# Patient Record
Sex: Male | Born: 1975 | Race: White | Hispanic: No | Marital: Married | Smoking: Never smoker
Health system: Southern US, Community
[De-identification: ages and names within clinical notes are randomized; demographics above are authoritative.]

## PROBLEM LIST (undated history)

## (undated) DIAGNOSIS — Z87442 Personal history of urinary calculi: Secondary | ICD-10-CM

## (undated) DIAGNOSIS — Z8619 Personal history of other infectious and parasitic diseases: Secondary | ICD-10-CM

## (undated) DIAGNOSIS — K219 Gastro-esophageal reflux disease without esophagitis: Secondary | ICD-10-CM

## (undated) HISTORY — DX: Personal history of other infectious and parasitic diseases: Z86.19

## (undated) HISTORY — DX: Personal history of urinary calculi: Z87.442

## (undated) HISTORY — DX: Gastro-esophageal reflux disease without esophagitis: K21.9

## (undated) HISTORY — PX: NASAL SINUS SURGERY: SHX719

---

## 2007-02-25 ENCOUNTER — Emergency Department: Payer: Self-pay | Admitting: Emergency Medicine

## 2007-03-14 ENCOUNTER — Emergency Department (HOSPITAL_COMMUNITY): Admission: EM | Admit: 2007-03-14 | Discharge: 2007-03-14 | Payer: Self-pay | Admitting: Family Medicine

## 2008-11-30 ENCOUNTER — Emergency Department (HOSPITAL_COMMUNITY): Admission: EM | Admit: 2008-11-30 | Discharge: 2008-11-30 | Payer: Self-pay | Admitting: Family Medicine

## 2008-12-20 IMAGING — CT CT STONE STUDY
1 of 2 series · 15 of 32 positions shown, 19 images · non-contrast
Comparison: none

REASON FOR EXAM: flank pain
COMMENTS:

[Series 2: soft tissue · axial · 0.73mm/px · z∈[-1145,-707]mm · 15 of 166 slices shown, 19 images]
[im 13/166  soft-tissue]
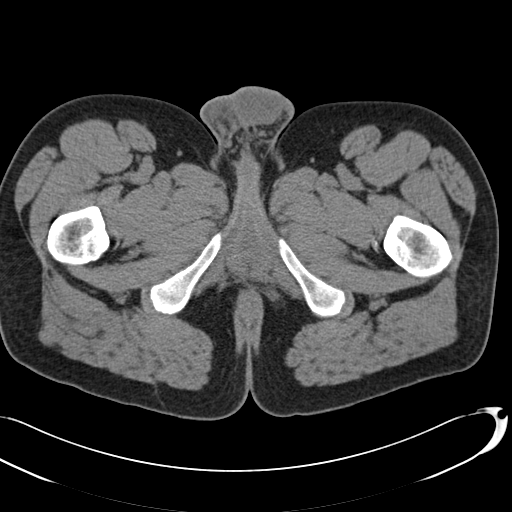
[im 13/166  bone]
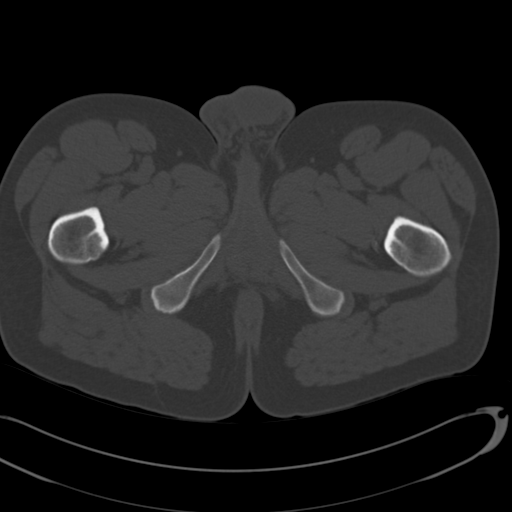
[im 25/166  soft-tissue]
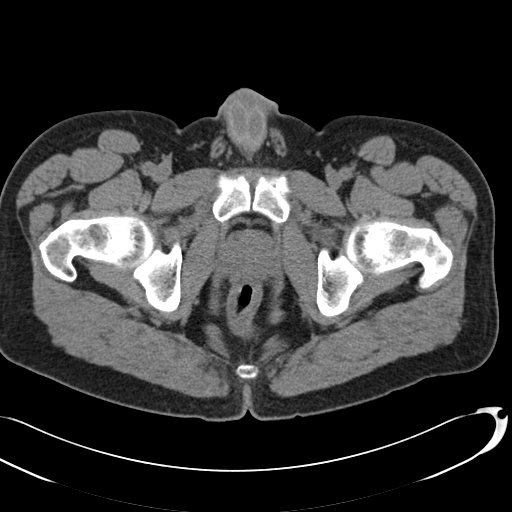
[im 37/166  soft-tissue]
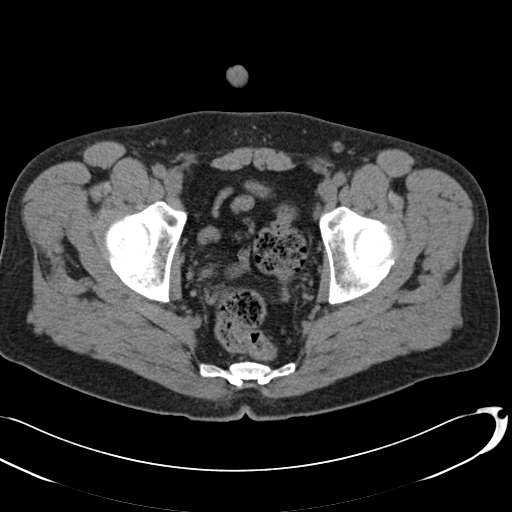
[im 49/166  soft-tissue]
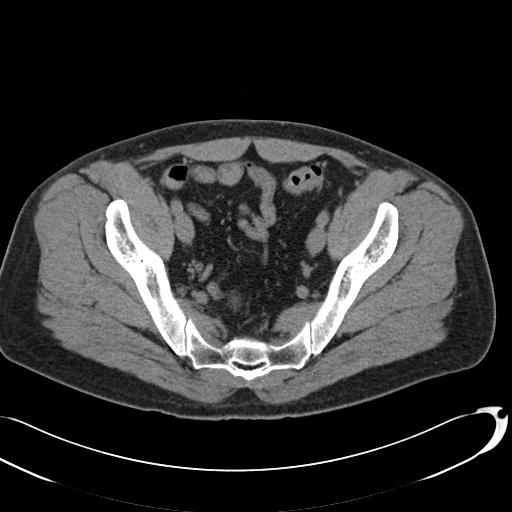
[im 62/166  soft-tissue]
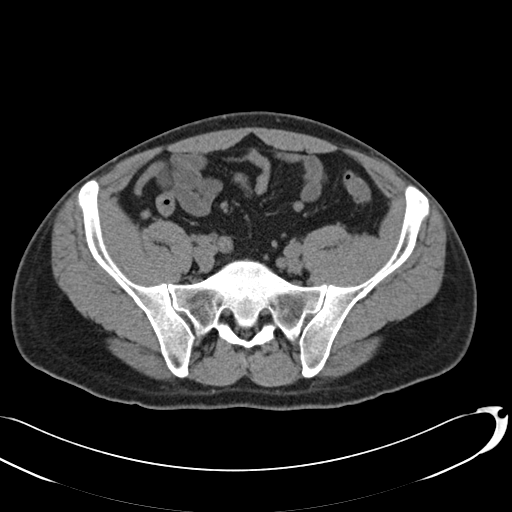
[im 74/166  soft-tissue]
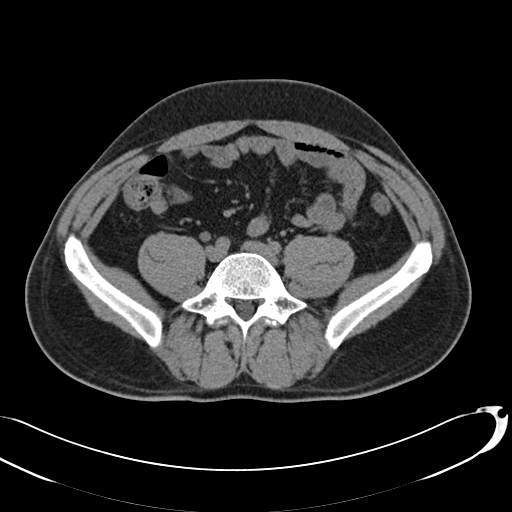
[im 86/166  soft-tissue]
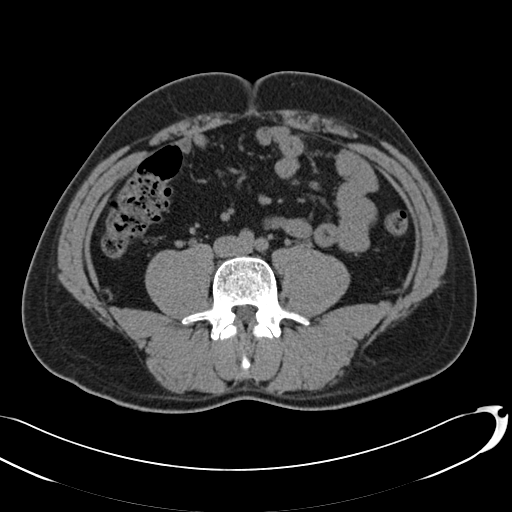
[im 98/166  soft-tissue]
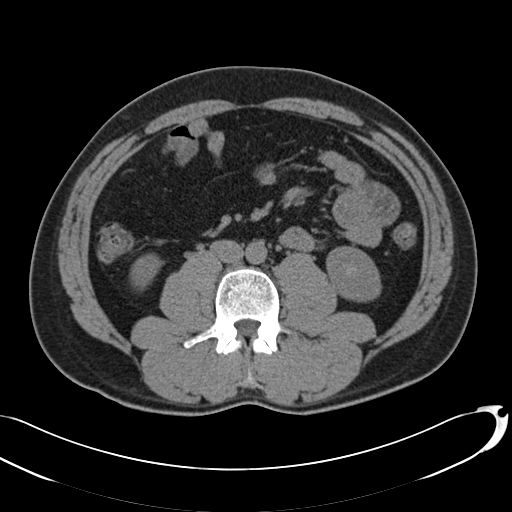
[im 111/166  soft-tissue]
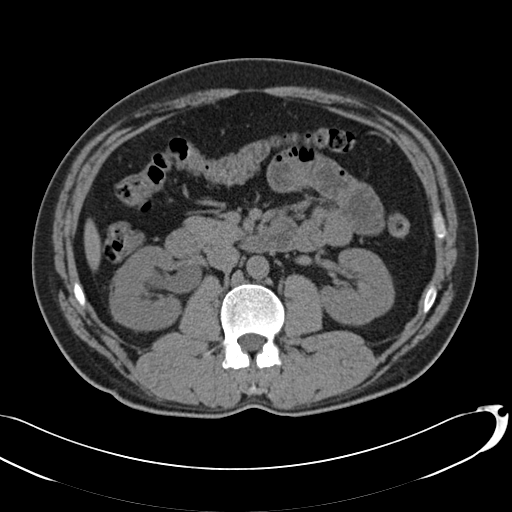
[im 111/166  bone]
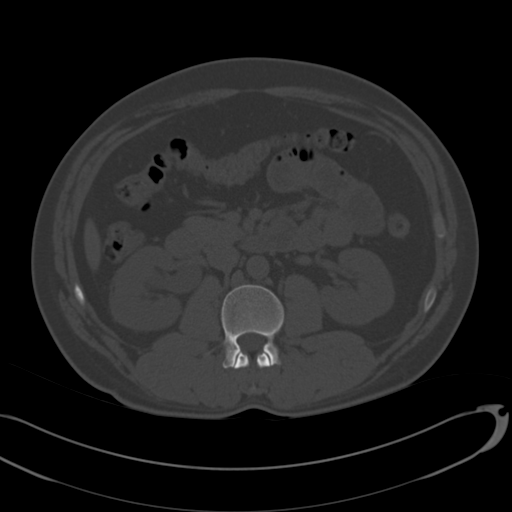
[im 123/166  soft-tissue]
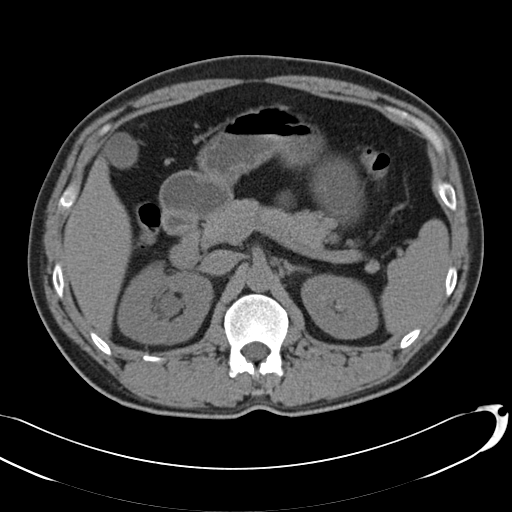
[im 135/166  soft-tissue]
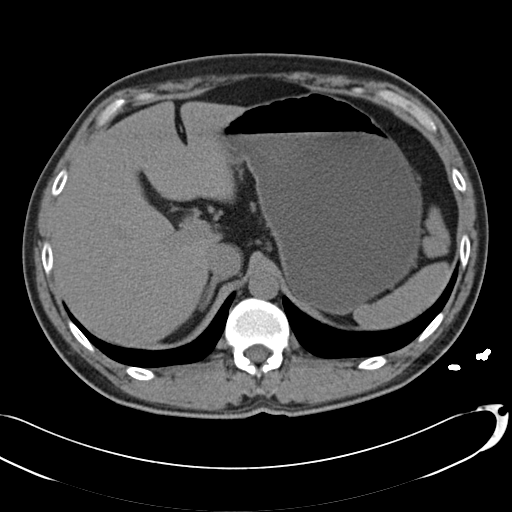
[im 141/166  lung]
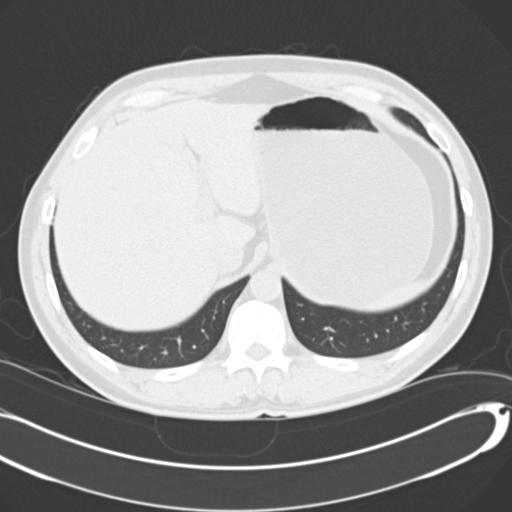
[im 147/166  soft-tissue]
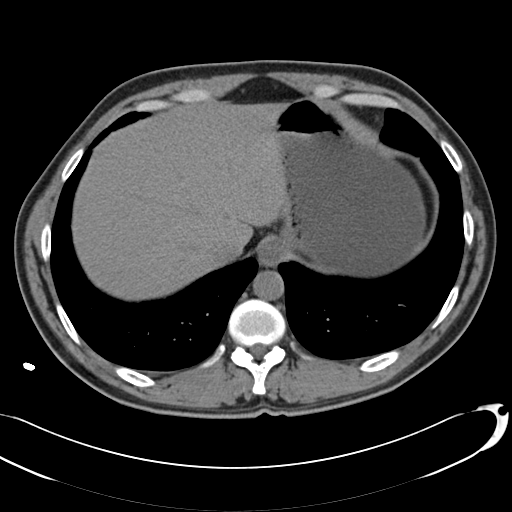
[im 147/166  lung]
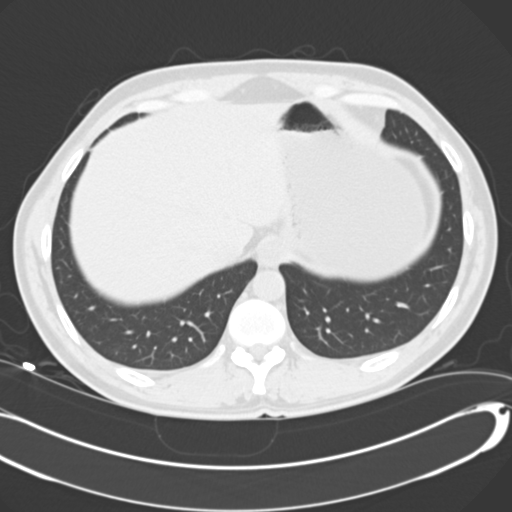
[im 153/166  lung]
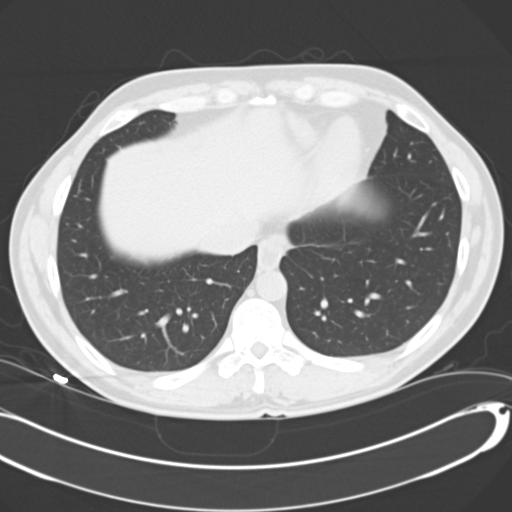
[im 159/166  soft-tissue]
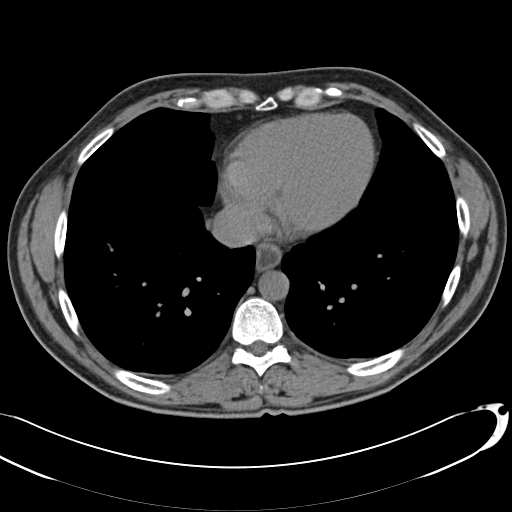
[im 159/166  lung]
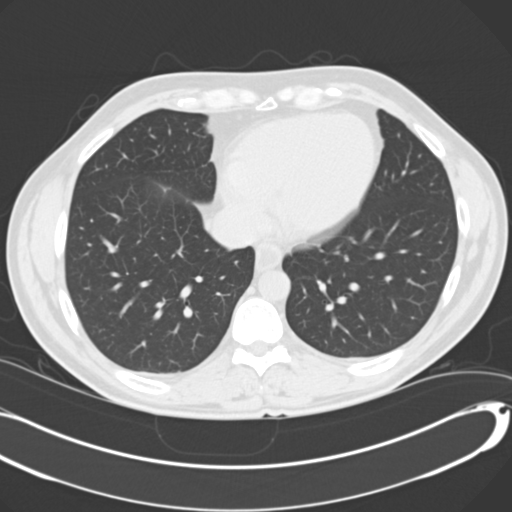

[15 of 32 positions shown; findings below may reference images not displayed]

PROCEDURE:     CT  - CT ABDOMEN /PELVIS WO (STONE)  - February 25, 2007  [DATE]

RESULT:     Helical non-contrast 3 mm sections were obtained from the lung
base through the pubic symphysis.

Evaluation of the lung bases demonstrates no gross abnormalities.

Within the limitations of a noncontrast CT, the liver, spleen, adrenals and
pancreas are unremarkable. Evaluation of the RIGHT kidney demonstrates a 3
mm non-obstructing medullary calculus. There is moderate hydronephrosis
involving the RIGHT kidney as well as moderate pelviectasis and moderate
hydroureter. Within the distal RIGHT ureter a 3.2 mm calculus is appreciated
in the region of the ureterovesical junction. Evaluation of the LEFT kidney
demonstrates subcentimeter calculi within the medullary portion of the
kidney. These are nonobstructing calculi. There is no evidence of abdominal
or pelvic free fluid, drainable loculated fluid collections, masses or
adenopathy. There is mild diverticulosis within the sigmoid colon.
IMPRESSION: 1)Vesicoureteral calculus on the RIGHT as described above with associated
moderate obstructive uropathy.

Dr. Gaur of the Emergency Room was informed of these findings at the time
of the initial interpretation.

## 2009-01-26 DIAGNOSIS — Z87442 Personal history of urinary calculi: Secondary | ICD-10-CM

## 2009-01-26 HISTORY — DX: Personal history of urinary calculi: Z87.442

## 2010-11-17 LAB — I-STAT 8, (EC8 V) (CONVERTED LAB)
BUN: 13
Glucose, Bld: 102 — ABNORMAL HIGH
Hemoglobin: 16.7
Potassium: 3.4 — ABNORMAL LOW
Sodium: 139

## 2010-11-17 LAB — POCT I-STAT CREATININE: Creatinine, Ser: 1

## 2011-04-13 ENCOUNTER — Encounter: Payer: Self-pay | Admitting: Family Medicine

## 2011-04-13 ENCOUNTER — Ambulatory Visit (INDEPENDENT_AMBULATORY_CARE_PROVIDER_SITE_OTHER): Payer: Self-pay | Admitting: Family Medicine

## 2011-04-13 DIAGNOSIS — Z0000 Encounter for general adult medical examination without abnormal findings: Secondary | ICD-10-CM

## 2011-04-13 DIAGNOSIS — R0609 Other forms of dyspnea: Secondary | ICD-10-CM

## 2011-04-13 DIAGNOSIS — K219 Gastro-esophageal reflux disease without esophagitis: Secondary | ICD-10-CM

## 2011-04-13 DIAGNOSIS — R0683 Snoring: Secondary | ICD-10-CM | POA: Insufficient documentation

## 2011-04-13 NOTE — Patient Instructions (Signed)
Return at your convenience fasting for blood work - we will get baseline cholesterol and sugar. Good to meet you today, call us with questions. Good job with healthy changes. Wear seat belt! Return as needed or in 1-2 years for next physical.

## 2011-04-13 NOTE — Assessment & Plan Note (Signed)
Reviewed preventative protocols and updated unless pt declined. Discussed healthy living, healthy diet.

## 2011-04-13 NOTE — Progress Notes (Signed)
Subjective:    Patient ID: Isaiah Mullins, male    DOB: November 05, 1975, 36 y.o.   MRN: [id number]  HPI CC: new pt establish, CPE  New pt, would like to establish.  No concerns today.  Would like CPE.  GERD - going on for 2 years, takes omeprazole 20mg  OTC daily.  Prior was on tums.  But this caused kidney stone.  Now on omeprazole.  No abd pain.  Sometimes clear mucous emesis.  Snoring - possible apneic episodes per wife (very quiet or seems like stops breathing then loud snore).  No PNdyspnea.  Restorative sleep.  Denies daytime sonmolence.  Preventative: Unsure last CPE.  Last blood work was 6 mo ago, for new job, unsure what was checked. No flu - declines Tetanus - ~2007 thinks, unsure. discussed seat belt use. Sunscreen when out in sun.  No skin concerns today.  Caffeine: some sweet tea, coffee but not daily Lives with wife, son (2003), 2 cats Occupation: Airline pilot rep at [employer]  Activity: running, working out 3-4days /wk.  Plays golf Diet: good water, vegetables daily, working on more fruits, currently on no wheat diet, red meat 2x/wk, fish 2x/wk  Medications and allergies reviewed and updated in chart.  Past histories reviewed and updated if relevant as below. There is no problem list on file for this patient.  Past Medical History  Diagnosis Date  . GERD (gastroesophageal reflux disease)   . History of kidney stones 01/2009    attributed to tums  . History of chicken pox    Past Surgical History  Procedure Date  . Nasal sinus surgery 1990s    deviated septum   History  Substance Use Topics  . Smoking status: Never Smoker   . Smokeless tobacco: Never Used  . Alcohol Use: Yes     social   Family History  Problem Relation Age of Onset  . Coronary artery disease Neg Hx   . Stroke Neg Hx   . Diabetes Neg Hx   . Cancer Neg Hx    No Known Allergies No current outpatient prescriptions on file prior to visit.   Review of Systems  Constitutional: Negative for  fever, chills, activity change, appetite change, fatigue and unexpected weight change.  HENT: Negative for hearing loss and neck pain.   Eyes: Negative for visual disturbance.  Respiratory: Negative for cough, chest tightness, shortness of breath and wheezing.   Cardiovascular: Negative for chest pain, palpitations and leg swelling.  Gastrointestinal: Negative for nausea, vomiting, abdominal pain, diarrhea, constipation, blood in stool and abdominal distention.  Genitourinary: Negative for hematuria and difficulty urinating.  Musculoskeletal: Negative for myalgias and arthralgias.  Skin: Negative for rash.  Neurological: Negative for dizziness, seizures, syncope and headaches.  Hematological: Does not bruise/bleed easily.  Psychiatric/Behavioral: Negative for dysphoric mood. The patient is not nervous/anxious.        Objective:   Physical Exam  Nursing note and vitals reviewed. Constitutional: He is oriented to person, place, and time. He appears well-developed and well-nourished. No distress.  HENT:  Head: Normocephalic and atraumatic.  Right Ear: Hearing, tympanic membrane, external ear and ear canal normal.  Left Ear: Hearing, tympanic membrane, external ear and ear canal normal.  Nose: Nose normal. No mucosal edema or rhinorrhea.  Mouth/Throat: Uvula is midline, oropharynx is clear and moist and mucous membranes are normal. No oropharyngeal exudate, posterior oropharyngeal edema, posterior oropharyngeal erythema or tonsillar abscesses.       No tonsillar hypertrophy  Eyes: Conjunctivae and EOM  are normal. Pupils are equal, round, and reactive to light. No scleral icterus.  Neck: Normal range of motion. Neck supple. No thyromegaly present.  Cardiovascular: Normal rate, regular rhythm, normal heart sounds and intact distal pulses.   No murmur heard. Pulses:      Radial pulses are 2+ on the right side, and 2+ on the left side.  Pulmonary/Chest: Effort normal and breath sounds normal.  No respiratory distress. He has no wheezes. He has no rales.  Abdominal: Soft. Bowel sounds are normal. He exhibits no distension and no mass. There is no tenderness. There is no rebound and no guarding.  Musculoskeletal: Normal range of motion.  Lymphadenopathy:    He has no cervical adenopathy.  Neurological: He is alert and oriented to person, place, and time.       CN grossly intact, station and gait intact  Skin: Skin is warm and dry. No rash noted.  Psychiatric: He has a normal mood and affect. His behavior is normal. Judgment and thought content normal.      Assessment & Plan:

## 2011-04-13 NOTE — Assessment & Plan Note (Signed)
Stable on omeprazole. 

## 2011-04-13 NOTE — Assessment & Plan Note (Addendum)
Restorative sleep, no daytime somnolence. Doubt significant OSA component. Discussed reasonable to do sleep study given wife endorsing worsening snoring, pt will think about it. epworth sleepiness score = 5, low risk today.  Already working on weight loss.

## 2011-04-19 ENCOUNTER — Telehealth: Payer: Self-pay | Admitting: Family Medicine

## 2011-04-19 NOTE — Telephone Encounter (Signed)
Noted. Thanks.

## 2011-04-19 NOTE — Telephone Encounter (Signed)
Triage Record Num: [id number] Operator: Di Kindle Patient Name: Isaiah Mullins Call Date & Time: 04/19/2011 11:57:20AM Patient Phone: [phone] PCP: Eustaquio Boyden Patient Gender: Male PCP Fax : [phone] Patient DOB: 07/13/1975 Practice Name: [facility] Reason for Call: Caller: Crista Elliot; PCP: Eustaquio Boyden; CB#: [phone]; Call regarding Cough/Congestion; with fever, onset 04/18/11, calling for appt. Same offered and accepted for 04/20/11 @ 1115 with Dr Para March. Protocol(s) Used: Information Only Call; No Symptom Triage (Adult) Recommended Outcome per Protocol: Call Provider When Office is Open Reason for Outcome: Requesting regular office appointment

## 2011-04-20 ENCOUNTER — Other Ambulatory Visit: Payer: Self-pay | Admitting: Family Medicine

## 2011-04-20 ENCOUNTER — Ambulatory Visit: Payer: Self-pay | Admitting: Family Medicine

## 2011-04-20 DIAGNOSIS — Z0000 Encounter for general adult medical examination without abnormal findings: Secondary | ICD-10-CM

## 2011-04-23 ENCOUNTER — Other Ambulatory Visit: Payer: Self-pay

## 2012-01-01 ENCOUNTER — Encounter: Payer: Self-pay | Admitting: Family Medicine

## 2012-01-01 ENCOUNTER — Ambulatory Visit (INDEPENDENT_AMBULATORY_CARE_PROVIDER_SITE_OTHER): Payer: BC Managed Care – PPO | Admitting: Family Medicine

## 2012-01-01 VITALS — BP 136/84 | HR 86 | Temp 98.4°F | Ht 71.0 in | Wt 187.2 lb

## 2012-01-01 DIAGNOSIS — J209 Acute bronchitis, unspecified: Secondary | ICD-10-CM | POA: Insufficient documentation

## 2012-01-01 DIAGNOSIS — J069 Acute upper respiratory infection, unspecified: Secondary | ICD-10-CM

## 2012-01-01 MED ORDER — GUAIFENESIN-CODEINE 100-10 MG/5ML PO SYRP: 5.0000 mL | ORAL_SOLUTION | Freq: Every evening | ORAL | Status: DC | PRN

## 2012-01-01 NOTE — Assessment & Plan Note (Signed)
With reassuring exam  Disc symptomatic care - see instructions on AVS  Given robitussin ac for night time use  Update if not starting to improve in a week or if worsening

## 2012-01-01 NOTE — Progress Notes (Signed)
  Subjective:    Patient ID: Isaiah Mullins, male    DOB: 10/29/1975, 36 y.o.   MRN: [id number]  HPI Here with uri symptoms Started sat night with chills and exhaustion  Going down hill ever since   Lots of post nasal drip- made him throw up several times  Taking mucinex  Clear mucous  Also bad cough - all night last night  Some prod from the drip  Ears are popping - pressure  Throat is not sore   Thinks he had a fever at home -not now   No particular sick contacts   Took some nyquil last night   Patient Active Problem List  Diagnosis  . Health care maintenance  . GERD (gastroesophageal reflux disease)  . Snoring   Past Medical History  Diagnosis Date  . GERD (gastroesophageal reflux disease)   . History of kidney stones 01/2009    attributed to tums  . History of chicken pox    Past Surgical History  Procedure Date  . Nasal sinus surgery 1990s    deviated septum   History  Substance Use Topics  . Smoking status: Never Smoker   . Smokeless tobacco: Never Used  . Alcohol Use: Yes     Comment: social   Family History  Problem Relation Age of Onset  . Coronary artery disease Neg Hx   . Stroke Neg Hx   . Diabetes Neg Hx   . Cancer Neg Hx    No Known Allergies Current Outpatient Prescriptions on File Prior to Visit  Medication Sig Dispense Refill  . omeprazole (PRILOSEC) 20 MG capsule Take 20 mg by mouth daily.          Review of Systems Review of Systems  Constitutional: Negative for  appetite change, and unexpected weight change. pos for fatigue Eyes: Negative for pain and visual disturbance.  ENT pos for congestion and post nasal drainage and ST, neg for sinus pain  Respiratory: Negative for wheeze and shortness of breath.   Cardiovascular: Negative for cp or palpitations    Gastrointestinal: Negative for nausea, diarrhea and constipation.  Genitourinary: Negative for urgency and frequency.  Skin: Negative for pallor or rash   Neurological:  Negative for weakness, light-headedness, numbness and headaches.  Hematological: Negative for adenopathy. Does not bruise/bleed easily.  Psychiatric/Behavioral: Negative for dysphoric mood. The patient is not nervous/anxious.         Objective:   Physical Exam  Constitutional: He appears well-developed and well-nourished. No distress.  HENT:  Head: Normocephalic and atraumatic.  Right Ear: External ear normal.  Left Ear: External ear normal.  Mouth/Throat: Oropharynx is clear and moist. No oropharyngeal exudate.       Nares are injected and congested   No facial tenderness  Eyes: Conjunctivae normal and EOM are normal. Pupils are equal, round, and reactive to light. Right eye exhibits no discharge. Left eye exhibits no discharge.  Neck: Normal range of motion. Neck supple.  Cardiovascular: Normal rate, regular rhythm, normal heart sounds and intact distal pulses.  Exam reveals no gallop.   Pulmonary/Chest: Effort normal and breath sounds normal. No respiratory distress. He has no wheezes. He has no rales. He exhibits no tenderness.  Lymphadenopathy:    He has no cervical adenopathy.  Neurological: He is alert.  Skin: Skin is warm and dry. No rash noted.  Psychiatric: He has a normal mood and affect.          Assessment & Plan:

## 2012-01-01 NOTE — Patient Instructions (Addendum)
Hold off the mucinex for now  Instead, take zyrtec 10 mg one pill daily - this will help dry up the drip  Use the codeine cough syrup at night to help cough  Tylenol for fever if needed  Breathe steam if you get congested, nasal saline spray helps too  Update if not starting to improve in a week or if worsening

## 2013-08-18 ENCOUNTER — Encounter: Payer: Self-pay | Admitting: Family Medicine

## 2013-08-18 ENCOUNTER — Ambulatory Visit (INDEPENDENT_AMBULATORY_CARE_PROVIDER_SITE_OTHER): Payer: BC Managed Care – PPO | Admitting: Family Medicine

## 2013-08-18 ENCOUNTER — Ambulatory Visit: Payer: Self-pay | Admitting: Family Medicine

## 2013-08-18 ENCOUNTER — Telehealth: Payer: Self-pay | Admitting: Family Medicine

## 2013-08-18 VITALS — BP 136/78 | HR 80 | Temp 98.0°F | Wt 181.5 lb

## 2013-08-18 DIAGNOSIS — J209 Acute bronchitis, unspecified: Secondary | ICD-10-CM

## 2013-08-18 MED ORDER — GUAIFENESIN-CODEINE 100-10 MG/5ML PO SYRP: 5.0000 mL | ORAL_SOLUTION | Freq: Two times a day (BID) | ORAL | Status: DC | PRN

## 2013-08-18 MED ORDER — AZITHROMYCIN 250 MG PO TABS: ORAL_TABLET | ORAL | Status: DC

## 2013-08-18 NOTE — Telephone Encounter (Signed)
Patient Information:  [name]  Phone: [phone]  Patient: Isaiah Mullins, Isaiah Mullins  Gender: Male  DOB: 05/05/1975  Age: 38 Years  PCP: Eustaquio BoydenGutierrez, [facility])  Office Follow Up:  Does the office need to follow up with this patient?: No  Instructions For The Office: N/A  RN Note:  No appointments remain for [facility]13 [facility] or [facility].  Scheduled at 1345 08/18/13 with Dr Tawanna Coolerodd at [facility] per caller request.   Symptoms  Reason For Call & Symptoms: Cold symptoms for 3-4 weeks with clear mucus and gagging at times.  Declined triage. Called for appointment and was transferred to triage due to lack of appointments. Life threatening symptoms ruled out.  Reviewed Health History In EMR: N/A  Reviewed Medications In EMR: N/A  Reviewed Allergies In EMR: N/A  Reviewed Surgeries / Procedures: N/A  Date of Onset of Symptoms: 07/20/2013  Guideline(s) Used:  No Protocol Available - Sick Adult  Disposition Per Guideline:   See Today in Office  Reason For Disposition Reached:   Patient wants to be seen  Advice Given:  Call Back If:  New symptoms develop  You become worse.  Patient Will Follow Care Advice:  YES  Appointment Scheduled:  08/18/2013 13:45:00 Appointment Scheduled Provider:  Other

## 2013-08-18 NOTE — Progress Notes (Signed)
Pre visit review using our clinic review tool, if applicable. No additional management support is needed unless otherwise documented below in the visit note. 

## 2013-08-18 NOTE — Progress Notes (Signed)
   BP 136/78  Pulse 80  Temp(Src) 98 F (36.7 C) (Oral)  Wt 181 lb 8 oz (82.328 kg)  SpO2 97%   CC: cough  Subjective:    Patient ID: Isaiah Mullins, male    DOB: 04/21/1975, 38 y.o.   MRN: [id number]  HPI: Isaiah Mullins is a 38 y.o. male presenting on 08/18/2013 for URI   1 mo h/o cough that started with ST and congestion and has progressed to persistent cough, PNdrainage with clear thick phlegm in throat in am, worse with phlegm in am.  + chest congestion.  No fevers/chills, abd pain, nausea, ear or tooth pain, headaches. No dyspnea or wheezing.  Tried dayquil, nyquil, mucinex. Wife sick with similar sxs - dx with URI.  Under more stress recently - going through bad divorce, trouble sleeping. H/o GERD - controlled on prilosec/nexium. No h/o allergic rhinitis, asthma. No smokers at home.  Relevant past medical, surgical, family and social history reviewed and updated as indicated.  Allergies and medications reviewed and updated. Current Outpatient Prescriptions on File Prior to Visit  Medication Sig  . omeprazole (PRILOSEC) 20 MG capsule Take 20 mg by mouth daily.   No current facility-administered medications on file prior to visit.    Review of Systems Per HPI unless specifically indicated above    Objective:    BP 136/78  Pulse 80  Temp(Src) 98 F (36.7 C) (Oral)  Wt 181 lb 8 oz (82.328 kg)  SpO2 97%  Physical Exam  Nursing note and vitals reviewed. Constitutional: He appears well-developed and well-nourished. No distress.  HENT:  Head: Normocephalic and atraumatic.  Right Ear: Hearing, tympanic membrane, external ear and ear canal normal.  Left Ear: Hearing, tympanic membrane, external ear and ear canal normal.  Nose: Nose normal. No mucosal edema or rhinorrhea. Right sinus exhibits no maxillary sinus tenderness and no frontal sinus tenderness. Left sinus exhibits no maxillary sinus tenderness and no frontal sinus tenderness.  Mouth/Throat: Uvula is  midline, oropharynx is clear and moist and mucous membranes are normal. No oropharyngeal exudate, posterior oropharyngeal edema, posterior oropharyngeal erythema or tonsillar abscesses.  Eyes: Conjunctivae and EOM are normal. Pupils are equal, round, and reactive to light. No scleral icterus.  Neck: Normal range of motion. Neck supple.  Cardiovascular: Normal rate, regular rhythm, normal heart sounds and intact distal pulses.   No murmur heard. Pulmonary/Chest: Effort normal and breath sounds normal. No respiratory distress. He has no wheezes. He has no rales.  Clear on exam today but persistent nagging cough present   Lymphadenopathy:    He has no cervical adenopathy.  Skin: Skin is warm and dry. No rash noted.       Assessment & Plan:   Problem List Items Addressed This Visit   Acute bronchitis - Primary     Given progression and duration of sxs - rec zpack abx to cover atypical organisms. Supportive care as per instructions. cheratussin for cough at night Red flags to return discussed.        Follow up plan: Return if symptoms worsen or fail to improve.

## 2013-08-18 NOTE — Assessment & Plan Note (Signed)
Given progression and duration of sxs - rec zpack abx to cover atypical organisms. Supportive care as per instructions. cheratussin for cough at night Red flags to return discussed.

## 2013-08-18 NOTE — Patient Instructions (Signed)
I think you do have bronchitis - treat with zpack antibiotic, codeine cough syrup for night time, and ibuprofen 400-600mg  twice daily with food during the day. Watch for fever >101, worsening productive cough or not improving as expected.

## 2013-08-18 NOTE — Telephone Encounter (Signed)
Will see today.  

## 2013-10-03 ENCOUNTER — Other Ambulatory Visit: Payer: Self-pay | Admitting: Family Medicine

## 2013-10-03 DIAGNOSIS — Z13220 Encounter for screening for lipoid disorders: Secondary | ICD-10-CM

## 2013-10-03 DIAGNOSIS — Z0000 Encounter for general adult medical examination without abnormal findings: Secondary | ICD-10-CM

## 2013-10-12 ENCOUNTER — Other Ambulatory Visit: Payer: BC Managed Care – PPO

## 2013-10-14 ENCOUNTER — Ambulatory Visit (INDEPENDENT_AMBULATORY_CARE_PROVIDER_SITE_OTHER): Payer: BC Managed Care – PPO | Admitting: Family Medicine

## 2013-10-14 ENCOUNTER — Encounter: Payer: Self-pay | Admitting: Family Medicine

## 2013-10-14 VITALS — BP 128/70 | HR 72 | Temp 98.0°F | Ht 71.0 in | Wt 185.5 lb

## 2013-10-14 DIAGNOSIS — K219 Gastro-esophageal reflux disease without esophagitis: Secondary | ICD-10-CM

## 2013-10-14 DIAGNOSIS — R198 Other specified symptoms and signs involving the digestive system and abdomen: Secondary | ICD-10-CM | POA: Insufficient documentation

## 2013-10-14 DIAGNOSIS — J392 Other diseases of pharynx: Secondary | ICD-10-CM

## 2013-10-14 DIAGNOSIS — Z0000 Encounter for general adult medical examination without abnormal findings: Secondary | ICD-10-CM

## 2013-10-14 DIAGNOSIS — Z13220 Encounter for screening for lipoid disorders: Secondary | ICD-10-CM

## 2013-10-14 LAB — BASIC METABOLIC PANEL
BUN: 18 mg/dL (ref 6–23)
CHLORIDE: 102 meq/L (ref 96–112)
CO2: 29 meq/L (ref 19–32)
CREATININE: 0.9 mg/dL (ref 0.4–1.5)
Calcium: 9.6 mg/dL (ref 8.4–10.5)
GFR: 96.75 mL/min (ref >60.00)
GLUCOSE: 88 mg/dL (ref 70–99)
POTASSIUM: 4 meq/L (ref 3.5–5.1)
Sodium: 137 mEq/L (ref 135–145)

## 2013-10-14 LAB — LIPID PANEL
CHOL/HDL RATIO: 4
CHOLESTEROL: 243 mg/dL — AB (ref 0–200)
HDL: 65.2 mg/dL (ref >39.00)
LDL CALC: 161 mg/dL — AB (ref 0–99)
NONHDL: 177.8
Triglycerides: 83 mg/dL (ref 0.0–149.0)
VLDL: 16.6 mg/dL (ref 0.0–40.0)

## 2013-10-14 MED ORDER — OMEPRAZOLE 40 MG PO CPDR: 40.0000 mg | DELAYED_RELEASE_CAPSULE | Freq: Every day | ORAL | Status: DC

## 2013-10-14 NOTE — Progress Notes (Signed)
Pre visit review using our clinic review tool, if applicable. No additional management support is needed unless otherwise documented below in the visit note. 

## 2013-10-14 NOTE — Patient Instructions (Signed)
Blood work today For mucous production - try increased omeprazole dose to 40mg  daily - I've sent this dose to your pharmacy. Try for next 3 weeks. If no noted improvement let me know, we may refer you to ENT for further evaluation Good to see you today, call us with quesitons. Return as needed or in 1-2 years for next physical

## 2013-10-14 NOTE — Assessment & Plan Note (Signed)
Describes increased mucous production worse in am that leads to 1-2 episodes of gagging up mucous each day, worse with food as well. ?GERD related as may have improved on higher omeprazole dose. No other rhinitis/PNdrainage symptoms. Will retrial increased omeprazole dose and if not better, consider ENT eval. No red flags today.

## 2013-10-14 NOTE — Assessment & Plan Note (Signed)
Preventative protocols reviewed and updated unless pt declined. Discussed healthy diet and lifestyle.  

## 2013-10-14 NOTE — Assessment & Plan Note (Signed)
See below - will increase omeprazole to 40mg  daily.

## 2013-10-14 NOTE — Progress Notes (Signed)
BP 128/70  Pulse 72  Temp(Src) 98 F (36.7 C) (Oral)  Ht 5\' 11"  (1.803 m)  Wt 185 lb 8 oz (84.142 kg)  BMI 25.88 kg/m2   CC: CPE  Subjective:    Patient ID: Isaiah Mullins, male    DOB: 03/11/1975, 38 y.o.   MRN: [id number]  HPI: Isaiah Mullins is a 38 y.o. male presenting on 10/14/2013 for Annual Exam   Wt Readings from Last 3 Encounters:  10/14/13 185 lb 8 oz (84.142 kg)  08/18/13 181 lb 8 oz (82.328 kg)  01/01/12 187 lb 4 oz (84.936 kg)   Body mass index is 25.88 kg/(m^2).  3 mo ago had irregular mole removed from R upper back (Luptom). 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  Bronchitis 07/2013. Treated with zpack which helped sxs, but has had persistent mucous productive that he expectorates/gagging. [place] [facility] and [facility] Also worse 30-45 min after eating. Always clear mucous, no food, no liquid. No noted PNDrainage. No h/o allergies, no allergic sxs like congestion or rhinorrhea or sneeze. No noted GERD unless he stops prilosec. Did take 40mg  prilosec which may have significantly helped. Cough also present if he takes a nap during the day. Worse in am  No nausea, good appetite, no early satiety, no dysphagia.  Preventative: flu shot - declines  Tetanus - ~2007 thinks. Discussed seat belt use.  Sunscreen when out in sun. No skin concerns today.   Caffeine: some sweet tea, coffee but not daily Divorced from wife 2015, son (2003), 2 cats Occupation: Tax advisersales rep at [employer]  Activity: running, working out 3-4days /wk.  Plays golf Diet: good water, vegetables daily, working on more fruits, currently on no wheat diet, red meat 2x/wk, fish 2x/wk  Relevant past medical, surgical, family and social history reviewed and updated as indicated.  Allergies and medications reviewed and updated. No current outpatient prescriptions on file prior to visit.   No current facility-administered medications on file prior to visit.    Review of Systems    Constitutional: Negative for fever, chills, activity change, appetite change, fatigue and unexpected weight change.  HENT: Negative for hearing loss.   Eyes: Negative for visual disturbance.  Respiratory: Negative for cough, chest tightness, shortness of breath and wheezing.   Cardiovascular: Negative for chest pain, palpitations and leg swelling.  Gastrointestinal: Negative for nausea, vomiting, abdominal pain, diarrhea, constipation, blood in stool and abdominal distention.  Genitourinary: Negative for hematuria and difficulty urinating.  Musculoskeletal: Negative for arthralgias, myalgias and neck pain.  Skin: Negative for rash.  Neurological: Negative for dizziness, seizures, syncope and headaches.  Hematological: Negative for adenopathy. Does not bruise/bleed easily.  Psychiatric/Behavioral: Negative for dysphoric mood. The patient is not nervous/anxious.    Per HPI unless specifically indicated above    Objective:    BP 128/70  Pulse 72  Temp(Src) 98 F (36.7 C) (Oral)  Ht 5\' 11"  (1.803 m)  Wt 185 lb 8 oz (84.142 kg)  BMI 25.88 kg/m2  Physical Exam  Nursing note and vitals reviewed. Constitutional: He is oriented to person, place, and time. He appears well-developed and well-nourished. No distress.  HENT:  Head: Normocephalic and atraumatic.  Right Ear: Hearing, tympanic membrane, external ear and ear canal normal.  Left Ear: Hearing, tympanic membrane, external ear and ear canal normal.  Nose: Nose normal.  Mouth/Throat: Uvula is midline, oropharynx is clear and moist and mucous membranes are normal. No oropharyngeal exudate, posterior oropharyngeal  edema or posterior oropharyngeal erythema.  Mild PNdrianage  Eyes: Conjunctivae and EOM are normal. Pupils are equal, round, and reactive to light. No scleral icterus.  Neck: Normal range of motion. Neck supple. No thyromegaly present.  Cardiovascular: Normal rate, regular rhythm, normal heart sounds and intact distal pulses.    No murmur heard. Pulses:      Radial pulses are 2+ on the right side, and 2+ on the left side.  Pulmonary/Chest: Effort normal and breath sounds normal. No respiratory distress. He has no wheezes. He has no rales.  Abdominal: Soft. Bowel sounds are normal. He exhibits no distension and no mass. There is no tenderness. There is no rebound and no guarding.  Musculoskeletal: Normal range of motion. He exhibits no edema.  Lymphadenopathy:    He has no cervical adenopathy.  Neurological: He is alert and oriented to person, place, and time.  CN grossly intact, station and gait intact  Skin: Skin is warm and dry. No rash noted.  Psychiatric: He has a normal mood and affect. His behavior is normal. Judgment and thought content normal.       Assessment & Plan:   Problem List Items Addressed This Visit   Episode of gagging     Describes increased mucous production worse in am that leads to 1-2 episodes of gagging up mucous each day, worse with food as well. ?GERD related as may have improved on higher omeprazole dose. No other rhinitis/PNdrainage symptoms. Will retrial increased omeprazole dose and if not better, consider ENT eval. No red flags today.    GERD (gastroesophageal reflux disease)     See below - will increase omeprazole to 40mg  daily.    Relevant Medications      omeprazole (PRILOSEC) capsule   Health care maintenance - Primary     Preventative protocols reviewed and updated unless pt declined. Discussed healthy diet and lifestyle.      Other Visit Diagnoses   Screening for lipoid disorders            Follow up plan: Return in about 2 years (around 10/15/2015), or as needed, for physical.

## 2013-10-16 ENCOUNTER — Encounter: Payer: Self-pay | Admitting: *Deleted

## 2013-11-24 ENCOUNTER — Telehealth: Payer: Self-pay

## 2013-11-24 NOTE — Telephone Encounter (Signed)
Pt was started on omeprazole 40 mg taking one daily and that is working well; pt is presently going thru a divorce, has started a new job and pt is having problems waking up several times during the night. Pt can go to sleep at night but cannot stay asleep. A friend gave pt couple of Klonopin 0.5 mg and pt took one at bedtime and rested well. Pt request rx of Klonopin to [facility]. Pt request cb.

## 2013-11-25 MED ORDER — CLONAZEPAM 0.5 MG PO TABS: 0.5000 mg | ORAL_TABLET | Freq: Every evening | ORAL | Status: DC | PRN

## 2013-11-25 NOTE — Telephone Encounter (Signed)
Patient notified as instructed by telephone. Rx called to pharmacy. 

## 2013-11-25 NOTE — Telephone Encounter (Signed)
I am sorry to hear about the divorce.  I would have him try unisom or benadryl at night first.  If he fails both, then I would defer to PCP.

## 2013-11-25 NOTE — Telephone Encounter (Signed)
I am sorry to hear this as well. Agree with trial of benadryl first  If not improved with this may try short temporary course of klonopin #20 - plz phone to pharmacy and notify patient.  Recommend only PRN. If needing more regularly will need to come in to discuss pros and cons of this medication.

## 2013-12-18 ENCOUNTER — Other Ambulatory Visit: Payer: Self-pay | Admitting: Family Medicine

## 2013-12-18 NOTE — Telephone Encounter (Signed)
Per Dr. Reece AgarG- Recommend only PRN. If needing more regularly will need to come in to discuss pros and cons of this medication.  I'll defer to Dr. Reece AgarG.  I didn't fill in the meantime. Routed to Dr. Reece AgarG as Lorain ChildesFYI.  Please notify patient.

## 2013-12-18 NOTE — Telephone Encounter (Signed)
Patient notified and follow up scheduled for next week.

## 2013-12-18 NOTE — Telephone Encounter (Signed)
Pt called requesting refill klonopin to [facility]. Pt is out of med and request refill done today. Pt request cb when done.

## 2013-12-18 NOTE — Telephone Encounter (Signed)
Ok to refill in Dr. Timoteo ExposeG's absence? Last filled 11/25/13. #20 with [id number]

## 2013-12-21 ENCOUNTER — Ambulatory Visit: Payer: BC Managed Care – PPO | Admitting: Family Medicine

## 2013-12-22 ENCOUNTER — Ambulatory Visit (INDEPENDENT_AMBULATORY_CARE_PROVIDER_SITE_OTHER): Payer: BC Managed Care – PPO | Admitting: Family Medicine

## 2013-12-22 ENCOUNTER — Encounter: Payer: Self-pay | Admitting: Family Medicine

## 2013-12-22 VITALS — BP 124/82 | HR 64 | Temp 98.0°F | Wt 190.2 lb

## 2013-12-22 DIAGNOSIS — G4733 Obstructive sleep apnea (adult) (pediatric): Secondary | ICD-10-CM | POA: Insufficient documentation

## 2013-12-22 DIAGNOSIS — R0683 Snoring: Secondary | ICD-10-CM

## 2013-12-22 DIAGNOSIS — G4700 Insomnia, unspecified: Secondary | ICD-10-CM

## 2013-12-22 MED ORDER — CLONAZEPAM 0.5 MG PO TABS: 0.5000 mg | ORAL_TABLET | Freq: Every evening | ORAL | Status: DC | PRN

## 2013-12-22 NOTE — Progress Notes (Signed)
Pre visit review using our clinic review tool, if applicable. No additional management support is needed unless otherwise documented below in the visit note. 

## 2013-12-22 NOTE — Progress Notes (Signed)
BP 124/82  Pulse 64  Temp(Src) 98 F (36.7 C) (Oral)  Wt 190 lb 4 oz (86.297 kg)   CC: discuss insomnia meds  Subjective:    Patient ID: Isaiah Mullins, male    DOB: 01/14/1976, 38 y.o.   MRN: [id number]  HPI: Isaiah Mullins is a 38 y.o. male presenting on 12/22/2013 for Medication Refill   See recent phone notes - increased stress recently going through divorce, started on klonopin for sleep which has worked well for him. Takes a full tablet nightly. Now out of klonopin for last 2 days.   Insomnia - increased stress recently. Going to court. 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 OTC remedies such as nyquil didn't help. Trouble with shutting off mind at night time. Trouble falling and staying asleep.   Wonders about sleep apnea - snores, stops breathing at night time. Restorative sleep with klonopin otherwise not. Endorses daytime sleepiness. Will call me when desires sleep study. H/o sinus surgery for deviated septum. Now mouth breather. Nasal sinus surgery has helped but when congested notes worsening snoring as well.  Relevant past medical, surgical, family and social history reviewed and updated as indicated.  Allergies and medications reviewed and updated. Current Outpatient Prescriptions on File Prior to Visit  Medication Sig  . omeprazole (PRILOSEC) 40 MG capsule Take 1 capsule (40 mg total) by mouth daily.   No current facility-administered medications on file prior to visit.    Review of Systems Per HPI unless specifically indicated above    Objective:    BP 124/82  Pulse 64  Temp(Src) 98 F (36.7 C) (Oral)  Wt 190 lb 4 oz (86.297 kg)  Physical Exam  Nursing note and vitals reviewed. Constitutional: He appears well-developed and well-nourished. No distress.  HENT:  Head: Normocephalic and atraumatic.  Nose: No mucosal edema or rhinorrhea.  Mouth/Throat: Uvula is midline, oropharynx is clear and moist and mucous membranes are normal. No oropharyngeal exudate, posterior  oropharyngeal edema, posterior oropharyngeal erythema or tonsillar abscesses.  Narrow nasal passage Mildly crowded oropharynx  Eyes: Conjunctivae and EOM are normal. Pupils are equal, round, and reactive to light.  Cardiovascular: Normal rate, regular rhythm and normal heart sounds.   No murmur heard. Pulmonary/Chest: Effort normal and breath sounds normal. No respiratory distress. He has no wheezes. He has no rales.  Musculoskeletal: He exhibits no edema.   Results for orders placed in visit on 10/14/13  BASIC METABOLIC PANEL      Result Value Ref Range   Sodium 137  135 - 145 mEq/L   Potassium 4.0  3.5 - 5.1 mEq/L   Chloride 102  96 - 112 mEq/L   CO2 29  19 - 32 mEq/L   Glucose, Bld 88  70 - 99 mg/dL   BUN 18  6 - 23 mg/dL   Creatinine, Ser 0.9  0.4 - 1.5 mg/dL   Calcium 9.6  8.4 - 95.610.5 mg/dL   GFR 21.3096.75  >86.57>60.00 mL/min  LIPID PANEL      Result Value Ref Range   Cholesterol 243 (*) 0 - 200 mg/dL   Triglycerides 84.683.0  0.0 - 149.0 mg/dL   HDL 96.2965.20  >52.84>39.00 mg/dL   VLDL 13.216.6  0.0 - 44.040.0 mg/dL   LDL Cholesterol 102161 (*) 0 - 99 mg/dL   Total CHOL/HDL Ratio 4     NonHDL 177.80        Assessment & Plan:   Problem List Items Addressed This Visit   Snoring - Primary  Endorses OSA sxs. Will call me if desire to pursue further eval with pulm referral for sleep study. ESS = 6    Insomnia     Sleep maintenance and initiation insomnia related to current stressors. Ok to continue klonopin temporary course for next 3-4 months, then reassess. Discussed risks of controlled med including addiction/abuse potential and dependence/tolerance.  Discussed if needing longer than temporary course will need to reassess, would consider antidepressant like trazodone. Pt agrees with plan. rtc 3-4 mo for f/u        Follow up plan: Return in about 3 months (around 03/24/2014), or as needed, for follow up visit.

## 2013-12-22 NOTE — Patient Instructions (Signed)
Continue klonopin 1 tablet at night, ok to take for next 1-2 months - if needing longer than this, we may need to re address. If you decide to stop med, will need to slowly taper off, not stop suddenly. Call me when you'd like to further evaluation for sleep apnea.

## 2013-12-22 NOTE — Assessment & Plan Note (Signed)
Endorses OSA sxs. Will call me if desire to pursue further eval with pulm referral for sleep study. ESS = 6

## 2013-12-22 NOTE — Assessment & Plan Note (Signed)
Sleep maintenance and initiation insomnia related to current stressors. Ok to continue klonopin temporary course for next 3-4 months, then reassess. Discussed risks of controlled med including addiction/abuse potential and dependence/tolerance.  Discussed if needing longer than temporary course will need to reassess, would consider antidepressant like trazodone. Pt agrees with plan. rtc 3-4 mo for f/u

## 2014-05-05 ENCOUNTER — Other Ambulatory Visit: Payer: Self-pay | Admitting: Family Medicine

## 2014-05-05 NOTE — Telephone Encounter (Signed)
plz phone in. 

## 2014-05-05 NOTE — Telephone Encounter (Signed)
Ok to refill 

## 2014-05-05 NOTE — Telephone Encounter (Signed)
Rx called in as directed.   

## 2014-05-22 ENCOUNTER — Other Ambulatory Visit: Payer: Self-pay | Admitting: Family Medicine

## 2014-08-20 ENCOUNTER — Ambulatory Visit: Payer: Self-pay | Admitting: Family Medicine

## 2014-10-12 ENCOUNTER — Telehealth: Payer: Self-pay | Admitting: Family Medicine

## 2014-10-12 DIAGNOSIS — R0683 Snoring: Secondary | ICD-10-CM

## 2014-10-12 NOTE — Telephone Encounter (Addendum)
Patient said when he came in for his physical, he and the doctor talked about him having a sleep study done.  Patient would like to set this up in [place].  Please advise.

## 2014-10-13 NOTE — Telephone Encounter (Signed)
referral placed. May send last office note.

## 2014-10-19 ENCOUNTER — Ambulatory Visit (INDEPENDENT_AMBULATORY_CARE_PROVIDER_SITE_OTHER): Payer: BLUE CROSS/BLUE SHIELD | Admitting: Internal Medicine

## 2014-10-19 ENCOUNTER — Encounter (INDEPENDENT_AMBULATORY_CARE_PROVIDER_SITE_OTHER): Payer: Self-pay

## 2014-10-19 ENCOUNTER — Encounter: Payer: Self-pay | Admitting: Internal Medicine

## 2014-10-19 VITALS — BP 126/86 | HR 99 | Ht 71.0 in | Wt 191.0 lb

## 2014-10-19 DIAGNOSIS — K219 Gastro-esophageal reflux disease without esophagitis: Secondary | ICD-10-CM

## 2014-10-19 DIAGNOSIS — R0683 Snoring: Secondary | ICD-10-CM

## 2014-10-19 DIAGNOSIS — G4700 Insomnia, unspecified: Secondary | ICD-10-CM

## 2014-10-19 NOTE — Progress Notes (Addendum)
[facility] Consultation      Assessment and Plan:  -Excessive daytime somnolence. The patient has snoring and witnessed apneas that are highly suspicious for sleep apnea. Examination of the nasal passages. Also shows narrowed nasal passages bilaterally, particularly on the left side. I'm sending him for a home sleep study. He can be referred for an in lab CPAP titration study if the home sleep study is positive.  -GERD. Continue home Prilosec  -Insomnia.   -Snoring  Addendum  12/10/14;  Reviewed split night study results from 9/19'16; showed severe OSA with initial AHI of 77.5. The patient was then titration on cpap with achievement of REM at CPAP of 10 with residual AHI of 8. Then titrated to 11cmH20 with residual AHI of 1.4   Date: 10/19/2014  MRN# [id number] Isaiah Mullins 09-Apr-1975  Referring Physician: Collins Scotland is a 39 y.o. old male seen in consultation for chief complaint of:   No chief complaint on file.  The patient is a 39 year old male with a history significant for sleepiness during the day as well as snoring and waking up at night. He notes that he is very restless at night, waking several times per night. Goes to bed at at 11pm, wakes at 730 on workdays, 10 on weekends. He sometimes dozes on couch, and catch himself snoring. His bed partner that he snores and sometimes stops breathing and then has a burst of snoring that will be heard from downstairs.  He is sleepy during the day, has never fallen asleep on the job.  He denies runny nose, but had a recent cold.   No sleepwalking, no sleep attacks. He has never smoked. He is no longer taking klonopin, he is taking prilosec daily for gerd.    HPI:   PMHX:   Past Medical History  Diagnosis Date  . GERD (gastroesophageal reflux disease)   . History of kidney stones 01/2009    attributed to tums  . History of chicken pox    Surgical Hx:  Past Surgical History    Procedure Laterality Date  . Nasal sinus surgery  1990s    deviated septum   Family Hx:  Family History  Problem Relation Age of Onset  . Coronary artery disease Neg Hx   . Stroke Neg Hx   . Diabetes Neg Hx   . Cancer Neg Hx    Social Hx:   Social History  Substance Use Topics  . Smoking status: Never Smoker   . Smokeless tobacco: Never Used  . Alcohol Use: Yes     Comment: social   Medication:   Current Outpatient Rx  Name  Route  Sig  Dispense  Refill  . clonazePAM (KLONOPIN) 0.5 MG tablet      TAKE 1 TABLET BY MOUTH AT BEDTIME   30 tablet   2     Not to exceed 3 additional fills before 06/20/2014   . omeprazole (PRILOSEC) 40 MG capsule      TAKE 1 CAPSULE (40 MG TOTAL) BY MOUTH DAILY.   30 capsule   6     PTB       Allergies:  Review of patient's allergies indicates no known allergies.  Review of Systems: Gen:  Denies  fever, sweats, chills HEENT: Denies blurred vision, double vision. bleeds, sore throat Cvc:  No dizziness, chest pain. Resp:   Denies cough or sputum porduction, shortness of breath Gi: Denies swallowing difficulty, stomach pain. Gu:  Denies  bladder incontinence, burning urine Ext:   No Joint pain, stiffness. Skin: No skin rash,  hives Endoc:  No polyuria, polydipsia. Psych: No depression, insomnia. Other:  All other systems were reviewed with the patient and were negative other that what is mentioned in the HPI.   Physical Examination:   VS: BP 126/86 mmHg  Pulse 99  Ht  (1.803 m)  Wt 191 lb (86.637 kg)  BMI 26.65 kg/m2  SpO2 97%  General Appearance: No distress  Neuro:without focal findings,  speech normal,  HEENT: PERRLA, EOM intact.  [place] 3. Positive nasal drip.  Pulmonary: normal breath sounds, No wheezing.  CardiovascularNormal S1,S2.  No m/r/g.   Abdomen: Benign, Soft, non-tender. Renal:  No costovertebral tenderness  GU:  No performed at this time. Endoc: No evident thyromegaly, no signs of  acromegaly. Skin:   warm, no rashes, no ecchymosis  Extremities: normal, no cyanosis, clubbing.  Other findings:    LABORATORY PANEL:   CBC No results for input(s): WBC, HGB, HCT, PLT in the last 168 hours. ------------------------------------------------------------------------------------------------------------------  Chemistries  No results for input(s): NA, K, CL, CO2, GLUCOSE, BUN, CREATININE, CALCIUM, MG, AST, ALT, ALKPHOS, BILITOT in the last 168 hours.  Invalid input(s): GFRCGP ------------------------------------------------------------------------------------------------------------------  Cardiac Enzymes No results for input(s): TROPONINI in the last 168 hours. ------------------------------------------------------------  RADIOLOGY:  No results found.     Orders for this visit: No orders of the defined types were placed in this encounter.     Thank  you for the consultation and for allowing [facility] to assist in the care of your patient. Our recommendations are noted above.  Please contact us if we can be of further service.   Wells Guiles, MD.  Board Certified in Internal Medicine, Pulmonary Medicine, Critical Care Medicine, and Sleep Medicine.  [facility] Office Number: [phone]  Santiago Glad, M.D.  Stephanie Acre, M.D.  Carolyne Fiscal, M.D

## 2014-10-19 NOTE — Patient Instructions (Addendum)
--  Will refer for home sleep study, if positive will need a in-lab sleep titration study.  --Follow up after all testing completed.

## 2014-10-20 ENCOUNTER — Telehealth: Payer: Self-pay | Admitting: *Deleted

## 2014-10-20 DIAGNOSIS — G4700 Insomnia, unspecified: Secondary | ICD-10-CM

## 2014-10-20 DIAGNOSIS — R0683 Snoring: Secondary | ICD-10-CM

## 2014-10-20 NOTE — Telephone Encounter (Signed)
Received fax in office stating the HST for evaluation for OSA was denied. Please advise on what you would like to do. Thanks. Will place copy of fax in your folder.

## 2014-10-20 NOTE — Telephone Encounter (Signed)
Place order for over night sleep test per PR. Order placed.

## 2014-11-15 ENCOUNTER — Telehealth: Payer: Self-pay | Admitting: *Deleted

## 2014-11-15 ENCOUNTER — Ambulatory Visit: Payer: BLUE CROSS/BLUE SHIELD | Attending: Pulmonary Disease

## 2014-11-15 DIAGNOSIS — G4733 Obstructive sleep apnea (adult) (pediatric): Secondary | ICD-10-CM | POA: Insufficient documentation

## 2014-11-15 NOTE — Telephone Encounter (Signed)
Pt left voicemail at Triage. Pt is having a sleep study done tonight and is requesting a one time Rx to help pt sleep tonight, pt request call back

## 2014-11-16 NOTE — Telephone Encounter (Signed)
Received message just now as I left early yesterday due to a meeting. plz call to f/u with patient - I'm sorry we didn't call him back. How did sleep study go?

## 2014-11-16 NOTE — Telephone Encounter (Signed)
Message left for patient to return my call.  

## 2014-11-18 DIAGNOSIS — G4733 Obstructive sleep apnea (adult) (pediatric): Secondary | ICD-10-CM | POA: Diagnosis not present

## 2014-11-23 ENCOUNTER — Other Ambulatory Visit: Payer: Self-pay | Admitting: *Deleted

## 2014-11-23 DIAGNOSIS — G4733 Obstructive sleep apnea (adult) (pediatric): Secondary | ICD-10-CM

## 2014-11-23 NOTE — Telephone Encounter (Signed)
Per chart-patient was able to complete sleep study without issue. Will close message.

## 2015-01-24 ENCOUNTER — Telehealth: Payer: Self-pay | Admitting: Family Medicine

## 2015-01-24 NOTE — Telephone Encounter (Signed)
Pt has appt 11/29/16at 11:15 with Dr Alphonsus SiasLetvak.

## 2015-01-24 NOTE — Telephone Encounter (Signed)
Will evaluate at OV 

## 2015-01-24 NOTE — Telephone Encounter (Signed)
[facility] - Client TELEPHONE ADVICE RECORD [facility]  Patient Name: Katina DegreeJASON Vanschaick  DOB: 08/30/1975    Initial Comment Caller states states that has abdominal pain, urine is orangish yellow, cold chills. states rates pain at a 1 right now    Nurse Assessment  Nurse: Tera Materowan, RN, Elnita Maxwellheryl Date/Time Lamount Cohen(Eastern Time): 01/24/2015 4:27:21 PM  Confirm and document reason for call. If symptomatic, describe symptoms. ---Caller states that he began having epigastric abd pain since Saturday. Pain is worse after eating or drinking coffee. He is also c/o dark colored urine and intermittent chills.  Has the patient traveled out of the country within the last 30 days? ---Not Applicable  Does the patient have any new or worsening symptoms? ---Yes  Will a triage be completed? ---Yes  Related visit to physician within the last 2 weeks? ---No  Does the PT have any chronic conditions? (i.e. diabetes, asthma, etc.) ---No  Is this a behavioral health call? ---No     Guidelines    Guideline Title Affirmed Question Affirmed Notes  Abdominal Pain - Male [1] MODERATE pain (e.g., interferes with normal activities) AND [2] pain comes and goes (cramps) AND [3] present > 24 hours (Exception: pain with Vomiting or Diarrhea - see that Guideline)    Final Disposition User   See Physician within 24 Hours Tera Materowan, RN, [employer]Cheryl    Referrals  REFERRED TO PCP OFFICE   Disagree/Comply: Danella Maiersomply

## 2015-01-25 ENCOUNTER — Ambulatory Visit (INDEPENDENT_AMBULATORY_CARE_PROVIDER_SITE_OTHER): Payer: BLUE CROSS/BLUE SHIELD | Admitting: Internal Medicine

## 2015-01-25 ENCOUNTER — Encounter: Payer: Self-pay | Admitting: Internal Medicine

## 2015-01-25 VITALS — BP 112/80 | HR 88 | Temp 98.1°F | Wt 191.5 lb

## 2015-01-25 DIAGNOSIS — R1013 Epigastric pain: Secondary | ICD-10-CM | POA: Insufficient documentation

## 2015-01-25 NOTE — Assessment & Plan Note (Signed)
History not really consistent with gastritis, ulcer, gallbladder disease or pancreatitis Better now so will hold off on testing May be reflux/caffeine related (he usually doesn't drink much caffeine) Discussed trying the omeprazole at night (since AM symptoms) and making sure he takes it on an empty stomach (he is inconsistent with this)

## 2015-01-25 NOTE — Patient Instructions (Signed)
Please take the omeprazole on an empty stomach and at bedtime. Try taking it twice a day--morning and bedtime-- for the next week, then just at bedtime.

## 2015-01-25 NOTE — Progress Notes (Signed)
Pre visit review using our clinic review tool, if applicable. No additional management support is needed unless otherwise documented below in the visit note. 

## 2015-01-25 NOTE — Progress Notes (Signed)
   Subjective:    Patient ID: Isaiah BestJason H Hoots, male    DOB: 09/02/1975, 39 y.o.   MRN: [id number]  HPI Here due to abdominal pain  Was at beach and noticed some abdominal pain 3 nights ago (was there for golf) Did have some energy drinks then Across upper abdomen. Didn't take anything Was restless but improved and felt better the next day Some pain again 2 nights ago--with cold chills Went to work yesterday---then had severe pain with cup of coffee Tried pain reliever then--and no pain since then  Urine color seemed to be orange-yellow yesterday---improved with more fluids No bowel color changes  Current Outpatient Prescriptions on File Prior to Visit  Medication Sig Dispense Refill  . omeprazole (PRILOSEC) 40 MG capsule TAKE 1 CAPSULE (40 MG TOTAL) BY MOUTH DAILY. 30 capsule 6  . clonazePAM (KLONOPIN) 0.5 MG tablet TAKE 1 TABLET BY MOUTH AT BEDTIME (Patient not taking: Reported on 01/25/2015) 30 tablet 2   No current facility-administered medications on file prior to visit.    No Known Allergies  Past Medical History  Diagnosis Date  . GERD (gastroesophageal reflux disease)   . History of kidney stones 01/2009    attributed to tums  . History of chicken pox     Past Surgical History  Procedure Laterality Date  . Nasal sinus surgery  1990s    deviated septum    Family History  Problem Relation Age of Onset  . Coronary artery disease Neg Hx   . Stroke Neg Hx   . Diabetes Neg Hx   . Cancer Neg Hx     Social History   Social History  . Marital Status: Married    Spouse Name: N/A  . Number of Children: N/A  . Years of Education: N/A   Occupational History  . Not on file.   Social History Main Topics  . Smoking status: Never Smoker   . Smokeless tobacco: Never Used  . Alcohol Use: Yes     Comment: social  . Drug Use: No  . Sexual Activity:    Partners: Female   Other Topics Concern  . Not on file   Social History Narrative   Caffeine: some sweet tea,  coffee but not daily   Divorced from wife 2015, son (2003), 2 cats   Occupation: Tax advisersales rep at [employer]    Activity: running, working out 3-4days /wk.  Plays golf   Diet: good water, vegetables daily, working on more fruits, currently on no wheat diet, red meat 2x/wk, fish 2x/wk   Review of Systems No N/V Appetite never big--but no change Awakens still with gag and mucus--only first thing in AM and then gone Still has some acid reflux symptoms--mostly with greasy food or marinara (and okay if he limits this) Weight is stable No cough or SOB No fever--but had the chills that one night    Objective:   Physical Exam  Constitutional: He appears well-developed and well-nourished. No distress.  Pulmonary/Chest: Effort normal and breath sounds normal. No respiratory distress. He has no wheezes. He has no rales.  Abdominal: Soft. Bowel sounds are normal. He exhibits no distension. There is no tenderness. There is no rebound and no guarding.          Assessment & Plan:

## 2015-01-29 ENCOUNTER — Other Ambulatory Visit: Payer: Self-pay | Admitting: Family Medicine

## 2015-03-04 ENCOUNTER — Other Ambulatory Visit: Payer: Self-pay

## 2015-03-04 MED ORDER — OMEPRAZOLE 40 MG PO CPDR: DELAYED_RELEASE_CAPSULE | ORAL | Status: DC

## 2015-03-04 NOTE — Telephone Encounter (Signed)
Printed and in Kim's box 

## 2015-03-04 NOTE — Telephone Encounter (Signed)
Pt has changed ins and wants to get printed rx for omeprazole to send to new mail order pharmacy. Pt request cb when ready for pick up. Last annual exam 10/14/13.

## 2015-03-08 MED ORDER — PANTOPRAZOLE SODIUM 40 MG PO TBEC: 40.0000 mg | DELAYED_RELEASE_TABLET | Freq: Every day | ORAL | Status: DC

## 2015-03-08 NOTE — Telephone Encounter (Signed)
Ok to change to pantoprazole 40mg  daily printed and in Kim's box

## 2015-03-08 NOTE — Telephone Encounter (Signed)
Patient notified and said that he has now found out that pantoprazole will be cheaper and he would like a Rx for that if you think it would be okay to change meds.

## 2015-03-10 NOTE — Telephone Encounter (Signed)
Spoke with patient and he will compare prices to Omeprazole OTC and the Protonix, pt will call back if he want us to send Protonix to [facility].

## 2016-02-22 ENCOUNTER — Other Ambulatory Visit: Payer: Self-pay | Admitting: Family Medicine

## 2016-02-22 MED ORDER — PANTOPRAZOLE SODIUM 40 MG PO TBEC: 40.0000 mg | DELAYED_RELEASE_TABLET | Freq: Every day | ORAL | 3 refills | Status: DC

## 2016-02-22 NOTE — Telephone Encounter (Signed)
Wants refill of something cheaper. Co-pay is $50

## 2016-02-22 NOTE — Telephone Encounter (Signed)
Price out protonix, if unaffordable, try zantac OTC.

## 2016-05-07 ENCOUNTER — Ambulatory Visit (INDEPENDENT_AMBULATORY_CARE_PROVIDER_SITE_OTHER): Payer: Self-pay | Admitting: Internal Medicine

## 2016-05-07 ENCOUNTER — Encounter: Payer: Self-pay | Admitting: Internal Medicine

## 2016-05-07 ENCOUNTER — Encounter (INDEPENDENT_AMBULATORY_CARE_PROVIDER_SITE_OTHER): Payer: Self-pay

## 2016-05-07 VITALS — BP 116/80 | HR 101 | Temp 98.4°F | Wt 199.0 lb

## 2016-05-07 DIAGNOSIS — R05 Cough: Secondary | ICD-10-CM

## 2016-05-07 DIAGNOSIS — R0982 Postnasal drip: Secondary | ICD-10-CM | POA: Diagnosis not present

## 2016-05-07 DIAGNOSIS — H6123 Impacted cerumen, bilateral: Secondary | ICD-10-CM

## 2016-05-07 DIAGNOSIS — R059 Cough, unspecified: Secondary | ICD-10-CM

## 2016-05-07 MED ORDER — HYDROCODONE-HOMATROPINE 5-1.5 MG/5ML PO SYRP: 5.0000 mL | ORAL_SOLUTION | Freq: Every evening | ORAL | 0 refills | Status: DC | PRN

## 2016-05-07 NOTE — Progress Notes (Signed)
HPI  Pt presents to the clinic today with c/o runny nose, post nasal drip and cough. This started 1 month ago. He is blowing clear mucous out of his nose. He is coughing up clear, sometimes yellow/green mucous. He denies nasal congestion, ear pain, sore throat or shortness of breath. He denies fever, chills or body aches. He feels like his symptoms are worse when he eats/drinks or when he breaths in cold air. He has tried Mucinex, Sudafed, Dayquil, Nyquil and Coricidin with minimal relief. He has a history of GERD and is taking Protonix as prescribed. He denies any history of seasonal allergies. He has had sick contacts. He does not take flu shots.  Past Medical History:  Diagnosis Date  . GERD (gastroesophageal reflux disease)   . History of chicken pox   . History of kidney stones 01/2009   attributed to tums    Current Outpatient Prescriptions  Medication Sig Dispense Refill  . pantoprazole (PROTONIX) 40 MG tablet Take 1 tablet (40 mg total) by mouth daily. 30 tablet 3  . clonazePAM (KLONOPIN) 0.5 MG tablet TAKE 1 TABLET BY MOUTH AT BEDTIME (Patient not taking: Reported on 05/07/2016) 30 tablet 2   No current facility-administered medications for this visit.     No Known Allergies  Family History  Problem Relation Age of Onset  . Coronary artery disease Neg Hx   . Stroke Neg Hx   . Diabetes Neg Hx   . Cancer Neg Hx     Social History   Social History  . Marital status: Married    Spouse name: N/A  . Number of children: N/A  . Years of education: N/A   Occupational History  . Not on file.   Social History Main Topics  . Smoking status: Never Smoker  . Smokeless tobacco: Never Used  . Alcohol use Yes     Comment: social  . Drug use: No  . Sexual activity: Yes    Partners: Female   Other Topics Concern  . Not on file   Social History Narrative   Caffeine: some sweet tea, coffee but not daily   Divorced from wife 2015, son (2003), 2 cats   Occupation: Tax advisersales rep  at [employer]    Activity: running, working out 3-4days /wk.  Plays golf   Diet: good water, vegetables daily, working on more fruits, currently on no wheat diet, red meat 2x/wk, fish 2x/wk    ROS:  Constitutional: Denies fever, malaise, fatigue, headache or abrupt weight changes.  HEENT: Pt reports runny nose. Denies eye pain, eye redness, ear pain, ringing in the ears, wax buildup, nasal congestion, bloody nose, or sore throat. Respiratory: Pt reports cough. Denies difficulty breathing, shortness of breath, or sputum production.   Cardiovascular: Denies chest pain, chest tightness, palpitations or swelling in the hands or feet.   No other specific complaints in a complete review of systems (except as listed in HPI above).  PE:  BP 116/80   Pulse (!) 101   Temp 98.4 F (36.9 C) (Oral)   Wt 199 lb (90.3 kg)   SpO2 97%   BMI 27.75 kg/m   Wt Readings from Last 3 Encounters:  05/07/16 199 lb (90.3 kg)  01/25/15 191 lb 8 oz (86.9 kg)  10/19/14 191 lb (86.6 kg)    General: Appears his stated age, in NAD. HEENT: Head: normal shape and size, no sinus tenderness  noted;  Ears: bilateral cerumen impaction; Throat/Mouth: Teeth present, mucosa pink and moist, no  lesions or ulcerations noted.  Neck: No adenopathy noted Cardiovascular: Normal rate and rhythm.  Pulmonary/Chest: Normal effort and positive vesicular breath sounds. No respiratory distress. No wheezes, rales or ronchi noted.   BMET    Component Value Date/Time   NA 137 10/14/2013 1118   K 4.0 10/14/2013 1118   CL 102 10/14/2013 1118   CO2 29 10/14/2013 1118   GLUCOSE 88 10/14/2013 1118   BUN 18 10/14/2013 1118   CREATININE 0.9 10/14/2013 1118   CALCIUM 9.6 10/14/2013 1118    Lipid Panel     Component Value Date/Time   CHOL 243 (H) 10/14/2013 1118   TRIG 83.0 10/14/2013 1118   HDL 65.20 10/14/2013 1118   CHOLHDL 4 10/14/2013 1118   VLDL 16.6 10/14/2013 1118   LDLCALC 161 (H) 10/14/2013 1118    CBC     Component Value Date/Time   HGB 16.7 03/14/2007 1956   HCT 49.0 03/14/2007 1956    Hgb A1C No results found for: HGBA1C   Assessment and Plan:  Cough secondary to PND:  No indications for abx at this time Start Zyrtec and Flonase RX for Hycodan for cough  Bilateral Cerumen Impaction:  He declines manual lavage today Advised him to try Debrox OTC  RTC as needed or if symptoms persist or worsen Jonte Wollam, NP

## 2016-05-07 NOTE — Patient Instructions (Signed)
Cough, Adult  A cough helps to clear your throat and lungs. A cough may last only 2?3 weeks (acute), or it may last longer than 8 weeks (chronic). Many different things can cause a cough. A cough may be a sign of an illness or another medical condition.  Follow these instructions at home:  ? Pay attention to any changes in your cough.  ? Take medicines only as told by your doctor.  ? If you were prescribed an antibiotic medicine, take it as told by your doctor. Do not stop taking it even if you start to feel better.  ? Talk with your doctor before you try using a cough medicine.  ? Drink enough fluid to keep your pee (urine) clear or pale yellow.  ? If the air is dry, use a cold steam vaporizer or humidifier in your home.  ? Stay away from things that make you cough at work or at home.  ? If your cough is worse at night, try using extra pillows to raise your head up higher while you sleep.  ? Do not smoke, and try not to be around smoke. If you need help quitting, ask your doctor.  ? Do not have caffeine.  ? Do not drink alcohol.  ? Rest as needed.  Contact a doctor if:  ? You have new problems (symptoms).  ? You cough up yellow fluid (pus).  ? Your cough does not get better after 2?3 weeks, or your cough gets worse.  ? Medicine does not help your cough and you are not sleeping well.  ? You have pain that gets worse or pain that is not helped with medicine.  ? You have a fever.  ? You are losing weight and you do not know why.  ? You have night sweats.  Get help right away if:  ? You cough up blood.  ? You have trouble breathing.  ? Your heartbeat is very fast.  This information is not intended to replace advice given to you by your health care provider. Make sure you discuss any questions you have with your health care provider.  Document Released: 10/26/2010 Document Revised: 07/21/2015 Document Reviewed: 04/21/2014  Elsevier Interactive Patient Education ? 2017 Elsevier Inc.

## 2016-06-27 ENCOUNTER — Telehealth: Payer: Self-pay

## 2016-06-27 NOTE — Telephone Encounter (Signed)
Pt last seen 05/07/16 with cough, runny nose, PND; this time pt has similar symptoms except 4 - 5 times a day after coughing episode pt will throw up clear thick mucus.  Pt wants to know if Dr Reece Agar can recommend a med or should pt get referral to ENT. Pt does have appt on 06/29/16 at 12:15 to see Dr Reece Agar unless Dr Reece Agar does not think needed to see pt. [facility]. Pt request cb.

## 2016-06-27 NOTE — Telephone Encounter (Signed)
Would offer appt between 9-10 tomorrow morning to be seen sooner.  Does he have hycodan cough syrup left over - would rec try this in the interim.

## 2016-06-27 NOTE — Telephone Encounter (Signed)
Patient returned Araceli's call and scheduled appointment for tomorrow at 9:00.

## 2016-06-27 NOTE — Telephone Encounter (Signed)
Left detailed message, if agreeable to come in sooner he is to call back to reschedule.

## 2016-06-28 ENCOUNTER — Encounter: Payer: Self-pay | Admitting: Family Medicine

## 2016-06-28 ENCOUNTER — Ambulatory Visit (INDEPENDENT_AMBULATORY_CARE_PROVIDER_SITE_OTHER): Payer: BLUE CROSS/BLUE SHIELD | Admitting: Family Medicine

## 2016-06-28 ENCOUNTER — Ambulatory Visit (INDEPENDENT_AMBULATORY_CARE_PROVIDER_SITE_OTHER)
Admission: RE | Admit: 2016-06-28 | Discharge: 2016-06-28 | Disposition: A | Discharge status: Home / Self Care | Payer: BLUE CROSS/BLUE SHIELD | Source: Ambulatory Visit | Attending: Family Medicine | Admitting: Family Medicine

## 2016-06-28 VITALS — BP 118/82 | HR 104 | Wt 191.8 lb

## 2016-06-28 DIAGNOSIS — R000 Tachycardia, unspecified: Secondary | ICD-10-CM | POA: Insufficient documentation

## 2016-06-28 DIAGNOSIS — J22 Unspecified acute lower respiratory infection: Secondary | ICD-10-CM | POA: Insufficient documentation

## 2016-06-28 DIAGNOSIS — R059 Cough, unspecified: Secondary | ICD-10-CM

## 2016-06-28 DIAGNOSIS — R05 Cough: Secondary | ICD-10-CM | POA: Diagnosis not present

## 2016-06-28 DIAGNOSIS — R198 Other specified symptoms and signs involving the digestive system and abdomen: Secondary | ICD-10-CM

## 2016-06-28 MED ORDER — AZITHROMYCIN 250 MG PO TABS: ORAL_TABLET | ORAL | 0 refills | Status: DC

## 2016-06-28 MED ORDER — PREDNISONE 20 MG PO TABS: ORAL_TABLET | ORAL | 0 refills | Status: DC

## 2016-06-28 MED ORDER — PANTOPRAZOLE SODIUM 40 MG PO TBEC: 40.0000 mg | DELAYED_RELEASE_TABLET | Freq: Every day | ORAL | 11 refills | Status: DC

## 2016-06-28 NOTE — Assessment & Plan Note (Signed)
Mild, sounds regular. Encouraged increased water intake. Will monitor this.

## 2016-06-28 NOTE — Assessment & Plan Note (Addendum)
3 mo h/o cough associated with post tussive emesis of thick clear mucous. ?post nasal drainage leading to coughing fits. Lungs clear today however given duration of symptoms will check CXR today. Treat as possible atypical infection with zpack. Treat PNdrainage and sinus inflammation with oral prednisone course. Update with effect next week, if no better will refer to ENT. Pt agrees with plan.

## 2016-06-28 NOTE — Patient Instructions (Signed)
Xray today. Antibiotic and steroid course today.  If no better with this, let me know for ENT referral.

## 2016-06-28 NOTE — Telephone Encounter (Signed)
Patient seen in office today. 

## 2016-06-28 NOTE — Progress Notes (Signed)
BP 118/82   Pulse (!) 104   Wt 191 lb 12.8 oz (87 kg)   SpO2 95%   BMI 26.75 kg/m    CC: cough Subjective:    Patient ID: Isaiah Mullins, male    DOB: Nov 19, 1975, 41 y.o.   MRN: [id number]  HPI: Isaiah Mullins is a 41 y.o. male presenting on 06/28/2016 for Cough (Pt c/o cough x 2 months. He states he has recently started to cough until vomiting, clear mucus)   Seen 2 months ago dx with post nasal drainage leading to cough, treated with zyrtec and flonase, hycodan Rx for cough suppressant. Zyrtec did help with constant cough. flonase was not helpful.   Ongoing cough over last 3 months. Cough mildly productive of mucous. Started with upper respiratory symptoms which have largely resolved. Now with several episodes per day of post tussive emesis - thick clear mucous comes up, varying amounts but large globs at a time. After coughing up mucous, cough improves for 3-4 hours then symptoms recur. Mild ongoing PNDrainage.   No fevers/chills, malaise, congestion, rhinorrhea, ST, chest pain, dyspnea or wheezing. No nausea, diarrhea/constipation. No abd pain, dysphagia.  No recent travel, long car or plane rides. No fmhx DVT/PE.   GERD well controlled on omeprazole.  Doesn't drink much milk. Doesn't drink much caffeine.   Relevant past medical, surgical, family and social history reviewed and updated as indicated. Interim medical history since our last visit reviewed. Allergies and medications reviewed and updated. Outpatient Medications Prior to Visit  Medication Sig Dispense Refill  . pantoprazole (PROTONIX) 40 MG tablet Take 1 tablet (40 mg total) by mouth daily. 30 tablet 3  . clonazePAM (KLONOPIN) 0.5 MG tablet TAKE 1 TABLET BY MOUTH AT BEDTIME (Patient not taking: Reported on 05/07/2016) 30 tablet 2  . HYDROcodone-homatropine (HYCODAN) 5-1.5 MG/5ML syrup Take 5 mLs by mouth at bedtime as needed for cough. 75 mL 0   No facility-administered medications prior to visit.     Past Medical  History:  Diagnosis Date  . GERD (gastroesophageal reflux disease)   . History of chicken pox   . History of kidney stones 01/2009   attributed to tums    Past Surgical History:  Procedure Laterality Date  . NASAL SINUS SURGERY  1990s   deviated septum    Per HPI unless specifically indicated in ROS section below Review of Systems     Objective:    BP 118/82   Pulse (!) 104   Wt 191 lb 12.8 oz (87 kg)   SpO2 95%   BMI 26.75 kg/m   Wt Readings from Last 3 Encounters:  06/28/16 191 lb 12.8 oz (87 kg)  05/07/16 199 lb (90.3 kg)  01/25/15 191 lb 8 oz (86.9 kg)    Physical Exam  Constitutional: He appears well-developed and well-nourished. No distress.  HENT:  Head: Normocephalic and atraumatic.  Right Ear: Hearing, tympanic membrane, external ear and ear canal normal.  Left Ear: Hearing, tympanic membrane, external ear and ear canal normal.  Nose: Mucosal edema (mild) present. No rhinorrhea. Right sinus exhibits no maxillary sinus tenderness and no frontal sinus tenderness. Left sinus exhibits no maxillary sinus tenderness and no frontal sinus tenderness.  Mouth/Throat: Uvula is midline, oropharynx is clear and moist and mucous membranes are normal. No oropharyngeal exudate, posterior oropharyngeal edema, posterior oropharyngeal erythema or tonsillar abscesses.  Thick white post nasal drainage  Eyes: Conjunctivae and EOM are normal. Pupils are equal, round, and reactive to light. No  scleral icterus.  Neck: Normal range of motion. Neck supple.  Cardiovascular: Normal rate, regular rhythm, normal heart sounds and intact distal pulses.   No murmur heard. Lungs clear  Pulmonary/Chest: Effort normal and breath sounds normal. No respiratory distress. He has no wheezes. He has no rales.  Lymphadenopathy:    He has no cervical adenopathy.  Skin: Skin is warm and dry. No rash noted.  Nursing note and vitals reviewed.  Results for orders placed or performed in visit on 10/14/13    Basic metabolic panel  Result Value Ref Range   Sodium 137 135 - 145 mEq/L   Potassium 4.0 3.5 - 5.1 mEq/L   Chloride 102 96 - 112 mEq/L   CO2 29 19 - 32 mEq/L   Glucose, Bld 88 70 - 99 mg/dL   BUN 18 6 - 23 mg/dL   Creatinine, Ser 0.9 0.4 - 1.5 mg/dL   Calcium 9.6 8.4 - 40.910.5 mg/dL   GFR 81.1996.75 >14.78>60.00 mL/min  Lipid panel  Result Value Ref Range   Cholesterol 243 (H) 0 - 200 mg/dL   Triglycerides 29.583.0 0.0 - 149.0 mg/dL   HDL 62.1365.20 >08.65>39.00 mg/dL   VLDL 78.416.6 0.0 - 69.640.0 mg/dL   LDL Cholesterol 295161 (H) 0 - 99 mg/dL   Total CHOL/HDL Ratio 4    NonHDL 177.80       Assessment & Plan:   Problem List Items Addressed This Visit    Cough - Primary    3 mo h/o cough associated with post tussive emesis of thick clear mucous. ?post nasal drainage leading to coughing fits. Lungs clear today however given duration of symptoms will check CXR today. Treat as possible atypical infection with zpack. Treat PNdrainage and sinus inflammation with oral prednisone course. Update with effect next week, if no better will refer to ENT. Pt agrees with plan.      Relevant Orders   DG Chest 2 View   Episode of gagging   Tachycardia    Mild, sounds regular. Encouraged increased water intake. Will monitor this.           Follow up plan: Return if symptoms worsen or fail to improve.  Isaiah BoydenJavier Djeneba Barsch, MD

## 2016-06-29 ENCOUNTER — Ambulatory Visit: Payer: Self-pay | Admitting: Family Medicine

## 2017-03-05 ENCOUNTER — Telehealth: Payer: Self-pay

## 2017-03-05 DIAGNOSIS — G4733 Obstructive sleep apnea (adult) (pediatric): Secondary | ICD-10-CM

## 2017-03-05 NOTE — Telephone Encounter (Signed)
Copied from CRM [id number]. Topic: Inquiry >> Mar 05, 2017 11:02 AM Eston Mouldavis, Cheri B wrote: Reason for CRM:  PT is interested in purchasing a cpap machine, he had a sleep study in 2016  and wants to know if he needs the study results when purchasing the machine

## 2017-03-05 NOTE — Telephone Encounter (Signed)
Last acute appt 06/28/16. No future appts scheduled.Please advise.

## 2017-03-05 NOTE — Telephone Encounter (Signed)
Spoke with pt relaying message per Dr. Reece AgarG.  Pt says he will contact Dr. Laurence Alyam's office.

## 2017-03-05 NOTE — Telephone Encounter (Signed)
Yes he will - recommend he contact Dr Laurence Alyam's office (pulmonologist he saw 2016) for referral for CPAP company.

## 2017-03-12 ENCOUNTER — Telehealth: Payer: Self-pay | Admitting: Internal Medicine

## 2017-03-12 NOTE — Telephone Encounter (Signed)
Spoke with pt and appt scheduled. Nothing further needed 

## 2017-03-12 NOTE — Telephone Encounter (Signed)
Pt is returning the call.  

## 2017-03-12 NOTE — Telephone Encounter (Signed)
lmov to schedule appt  °

## 2017-03-12 NOTE — Telephone Encounter (Signed)
Patient wants to by his cpap without filing insurance and he needs Dr. Ardyth Manam to authorize with  [facility].  He wants to know if he can do this without an appt .    Please advise

## 2017-03-12 NOTE — Telephone Encounter (Signed)
LMOM for pt letting him know that since he hasn't been seen since 09/2014 he will have to come in for an appt before we can send any orders to [facility] even if he is buying the CPAP out of pocket. Left number for him to call back to schedule and with any further questions.

## 2017-03-13 ENCOUNTER — Ambulatory Visit (INDEPENDENT_AMBULATORY_CARE_PROVIDER_SITE_OTHER): Payer: BLUE CROSS/BLUE SHIELD | Admitting: Internal Medicine

## 2017-03-13 ENCOUNTER — Encounter: Payer: Self-pay | Admitting: Internal Medicine

## 2017-03-13 VITALS — BP 138/84 | HR 94 | Resp 16 | Ht 71.0 in | Wt 199.0 lb

## 2017-03-13 DIAGNOSIS — G4733 Obstructive sleep apnea (adult) (pediatric): Secondary | ICD-10-CM

## 2017-03-13 NOTE — Addendum Note (Signed)
Addended by: Janean SarkSNIPES, SONYA K on: 03/13/2017 10:22 AM   Modules accepted: Orders

## 2017-03-13 NOTE — Patient Instructions (Signed)
Use CPAP every night.     Sleep Apnea Sleep apnea is disorder that affects a person's sleep. A person with sleep apnea has abnormal pauses in their breathing when they sleep. It is hard for them to get a good sleep. This makes a person tired during the day. It also can lead to other physical problems. There are three types of sleep apnea. One type is when breathing stops for a short time because your airway is blocked (obstructive sleep apnea). Another type is when the brain sometimes fails to give the normal signal to breathe to the muscles that control your breathing (central sleep apnea). The third type is a combination of the other two types. HOME CARE   Take all medicine as told by your doctor.  Avoid alcohol, calming medicines (sedatives), and depressant drugs.  Try to lose weight if you are overweight. Talk to your doctor about a healthy weight goal.  Your doctor may have you use a device that helps to open your airway. It can help you get the air that you need. It is called a positive airway pressure (PAP) device.   MAKE SURE YOU:   Understand these instructions.  Will watch your condition.  Will get help right away if you are not doing well or get worse.  It may take approximately 1 month for you to get used to wearing her CPAP every night.  Be sure to work with your machine to get used to it, be patient, it may take time!

## 2017-03-13 NOTE — Progress Notes (Signed)
[facility]      Assessment and Plan:  -Obstructive sleep apnea. --Start auto-CPAP with pressure range of 10-16 cm H2O.  --Continued daytime sleepiness.    -GERD. Continue home Prilosec     Date: 03/13/2017  MRN# [id number] Carl BestJason H Puffenbarger 09/25/1975  Referring Physician: Collins ScotlandGUTIERREZ, JAVIER  Isaiah Mullins is a 42 y.o. old male seen in consultation for chief complaint of:    Chief Complaint  Patient presents with  . Sleep Apnea    Pt here for cpap dme order. He has not been seen since 2016.   The patient is a 42 year old male.  He was last seen on 10/19/14 for symptoms of excessive daytime sleepiness.  He was sent for a sleep study on 11/15/14 which showed severe OSA.  The initial AHI was 77, he was then split and started on CPAP.  At CPAP level of 11 residual AHI was 1.4. At that time the machine was too expensive. He now wants to start CPAP.  He is considering buying machine out right he has a selected a auto device which is approximately $1000.  I suggested to him that it might be less expensive in the long run to do this through his insurance company.  He will look into this and consider going through insurance to get his new device.  No sleepwalking, no sleep attacks. He has never smoked. He is no longer taking klonopin, he is taking prilosec daily for gerd.    HPI:  Social Hx:   Social History   Tobacco Use  . Smoking status: Never Smoker  . Smokeless tobacco: Never Used  Substance Use Topics  . Alcohol use: Yes    Comment: social  . Drug use: No   Medication:   Current Outpatient Rx  . Order #: [id number] Class: Normal      Allergies:  Patient has no known allergies.  Review of Systems: Gen:  Denies  fever, sweats, chills HEENT: Denies blurred vision, double vision. bleeds, sore throat Cvc:  No dizziness, chest pain. Resp:   Denies cough or sputum porduction, shortness of breath Gi: Denies swallowing difficulty, stomach  pain. Gu:  Denies bladder incontinence, burning urine Ext:   No Joint pain, stiffness. Skin: No skin rash,  hives Endoc:  No polyuria, polydipsia. Psych: No depression, insomnia. Other:  All other systems were reviewed with the patient and were negative other that what is mentioned in the HPI.   Physical Examination:   VS: BP 138/84 (BP Location: Left Arm, Cuff Size: Normal)   Pulse 94   Resp 16   Ht 5\' 11"  (1.803 m)   Wt 199 lb (90.3 kg)   SpO2 98%   BMI 27.75 kg/m   General Appearance: No distress  Neuro:without focal findings,  speech normal,  HEENT: PERRLA, EOM intact.  [place] 3. Positive nasal drip.  Pulmonary: normal breath sounds, No wheezing.  CardiovascularNormal S1,S2.  No m/r/g.   Abdomen: Benign, Soft, non-tender. Renal:  No costovertebral tenderness  GU:  No performed at this time. Endoc: No evident thyromegaly, no signs of acromegaly. Skin:   warm, no rashes, no ecchymosis  Extremities: normal, no cyanosis, clubbing.  Other findings:    LABORATORY PANEL:   CBC No results for input(s): WBC, HGB, HCT, PLT in the last 168 hours. ------------------------------------------------------------------------------------------------------------------  Chemistries  No results for input(s): NA, K, CL, CO2, GLUCOSE, BUN, CREATININE, CALCIUM, MG, AST, ALT, ALKPHOS, BILITOT in the last 168 hours.  Invalid input(s): GFRCGP ------------------------------------------------------------------------------------------------------------------  Cardiac Enzymes No results for input(s): TROPONINI in the last 168 hours. ------------------------------------------------------------  RADIOLOGY:  No results found.     Orders for this visit: No orders of the defined types were placed in this encounter.    Thank  you for the consultation and for allowing [facility] to assist in the care of your patient. Our recommendations are noted above.  Please  contact us if we can be of further service.   Wells Guiles, MD.  Board Certified in Internal Medicine, Pulmonary Medicine, Critical Care Medicine, and Sleep Medicine.  [facility] Office Number: [phone]  Santiago Glad, M.D.  Carolyne Fiscal, M.D

## 2017-04-26 ENCOUNTER — Ambulatory Visit (INDEPENDENT_AMBULATORY_CARE_PROVIDER_SITE_OTHER): Payer: BLUE CROSS/BLUE SHIELD | Admitting: Family Medicine

## 2017-04-26 ENCOUNTER — Encounter: Payer: Self-pay | Admitting: Family Medicine

## 2017-04-26 VITALS — BP 118/80 | HR 89 | Temp 98.1°F | Wt 202.0 lb

## 2017-04-26 DIAGNOSIS — J22 Unspecified acute lower respiratory infection: Secondary | ICD-10-CM | POA: Diagnosis not present

## 2017-04-26 MED ORDER — AZITHROMYCIN 250 MG PO TABS: ORAL_TABLET | ORAL | 0 refills | Status: DC

## 2017-04-26 MED ORDER — HYDROCOD POLST-CPM POLST ER 10-8 MG/5ML PO SUER: 5.0000 mL | Freq: Every evening | ORAL | 0 refills | Status: DC | PRN

## 2017-04-26 NOTE — Progress Notes (Signed)
BP 118/80 (BP Location: Left Arm, Patient Position: Sitting, Cuff Size: Normal)   Pulse 89   Temp 98.1 F (36.7 C) (Oral)   Wt 202 lb (91.6 kg)   SpO2 98%   BMI 28.17 kg/m    CC: cough Subjective:    Patient ID: Isaiah BestJason H Dumont, male    DOB: 08/20/1975, 42 y.o.   MRN: [id number]  HPI: Isaiah Mullins is a 42 y.o. male presenting on 04/26/2017 for Cough (Productive- green mucous cough, nasal congestion/drainage and chest congestion. Cough is worse at night and ususally after eating. Denies any fever, N/V/D. Started about 3 wks ago. Tried several OTC cough meds, no help.)   3 wk h/o productive cough of green mucous, chest > head congestion. + PNDrainage. Cough worse at night time. Persistent nagging cough, gets into coughing fits with post tussive emesis.   No fevers/chills, ear or tooth pain, ST, dyspnea or wheezing.   Has tried pseudophed, mucinex, nyquil, advil cold and flu with temporary relief.  Non smoker No h/o asthma.  He started CPAP use - significant improvement with this Nicholos Johns(Ramachandran)!  Relevant past medical, surgical, family and social history reviewed and updated as indicated. Interim medical history since our last visit reviewed. Allergies and medications reviewed and updated. Outpatient Medications Prior to Visit  Medication Sig Dispense Refill  . pantoprazole (PROTONIX) 40 MG tablet Take 1 tablet (40 mg total) by mouth daily. 30 tablet 11   No facility-administered medications prior to visit.      Per HPI unless specifically indicated in ROS section below Review of Systems     Objective:    BP 118/80 (BP Location: Left Arm, Patient Position: Sitting, Cuff Size: Normal)   Pulse 89   Temp 98.1 F (36.7 C) (Oral)   Wt 202 lb (91.6 kg)   SpO2 98%   BMI 28.17 kg/m   Wt Readings from Last 3 Encounters:  04/26/17 202 lb (91.6 kg)  03/13/17 199 lb (90.3 kg)  06/28/16 191 lb 12.8 oz (87 kg)    Physical Exam  Constitutional: He appears well-developed and  well-nourished. No distress.  HENT:  Head: Normocephalic and atraumatic.  Right Ear: Hearing, tympanic membrane, external ear and ear canal normal.  Left Ear: Hearing, tympanic membrane, external ear and ear canal normal.  Nose: Mucosal edema (nasal mucosal congestion) present. No rhinorrhea. Right sinus exhibits no maxillary sinus tenderness and no frontal sinus tenderness. Left sinus exhibits no maxillary sinus tenderness and no frontal sinus tenderness.  Mouth/Throat: Uvula is midline and mucous membranes are normal. No oropharyngeal exudate, posterior oropharyngeal edema, posterior oropharyngeal erythema or tonsillar abscesses.  Post nasal drainage  Eyes: Conjunctivae and EOM are normal. Pupils are equal, round, and reactive to light. No scleral icterus.  Neck: Normal range of motion. Neck supple.  Cardiovascular: Normal rate, regular rhythm, normal heart sounds and intact distal pulses.  No murmur heard. Pulmonary/Chest: Effort normal and breath sounds normal. No respiratory distress. He has no wheezes. He has no rales.  Nagging cough present Lungs clear  Lymphadenopathy:    He has no cervical adenopathy.  Skin: Skin is warm and dry. No rash noted.  Nursing note and vitals reviewed.   DG Chest 2 View CLINICAL DATA:  Cough for 3 months.  EXAM: CHEST  2 VIEW  COMPARISON:  12/30/8  FINDINGS: Normal heart size. No pleural effusion or edema. No airspace opacities identified. Age indeterminate left anterior fourth fifth and sixth rib fractures.  IMPRESSION: No evidence for  pneumonia.  Age-indeterminate left anterior rib fractures.  Electronically Signed   By: Signa Kell M.D.   On: 06/28/2016 09:54      Assessment & Plan:   Problem List Items Addressed This Visit    Acute respiratory infection - Primary    Ongoing 3 wks. Cover for bacterial/atypical bronchitis with zpack antibiotic. tussionex for cough. Ibuprofen for inflammation. Update if not improving with  treatment to consider steroid course. H/o recurrent bronchitis needing 3+ months to resolve in the past.       Relevant Medications   azithromycin (ZITHROMAX) 250 MG tablet       Meds ordered this encounter  Medications  . azithromycin (ZITHROMAX) 250 MG tablet    Sig: Take two tablets on day one followed by one tablet on days 2-5    Dispense:  6 each    Refill:  0  . chlorpheniramine-HYDROcodone (TUSSIONEX PENNKINETIC ER) 10-8 MG/5ML SUER    Sig: Take 5 mLs by mouth at bedtime as needed for cough.    Dispense:  140 mL    Refill:  0   No orders of the defined types were placed in this encounter.   Follow up plan: Return if symptoms worsen or fail to improve.  Eustaquio Boyden, MD

## 2017-04-26 NOTE — Assessment & Plan Note (Signed)
Ongoing 3 wks. Cover for bacterial/atypical bronchitis with zpack antibiotic. tussionex for cough. Ibuprofen for inflammation. Update if not improving with treatment to consider steroid course. H/o recurrent bronchitis needing 3+ months to resolve in the past.

## 2017-04-26 NOTE — Patient Instructions (Addendum)
For bronchitis - treat with zpack, and tussionex pennkinetic cough syrup.  May take ibuprofen 400-600mg  as needed with meals as well.  Push fluids and rest.  Let us know if not better with this.

## 2017-07-26 ENCOUNTER — Other Ambulatory Visit: Payer: Self-pay | Admitting: Family Medicine

## 2017-08-17 ENCOUNTER — Other Ambulatory Visit: Payer: Self-pay | Admitting: Family Medicine

## 2017-09-09 ENCOUNTER — Encounter: Payer: Self-pay | Admitting: Family Medicine

## 2017-09-09 ENCOUNTER — Ambulatory Visit: Payer: BLUE CROSS/BLUE SHIELD | Admitting: Family Medicine

## 2017-09-09 ENCOUNTER — Ambulatory Visit (INDEPENDENT_AMBULATORY_CARE_PROVIDER_SITE_OTHER): Payer: BLUE CROSS/BLUE SHIELD | Admitting: Family Medicine

## 2017-09-09 VITALS — BP 140/82 | HR 88 | Temp 98.8°F | Ht 71.0 in | Wt 201.2 lb

## 2017-09-09 DIAGNOSIS — S2341XA Sprain of ribs, initial encounter: Secondary | ICD-10-CM | POA: Diagnosis not present

## 2017-09-09 MED ORDER — METHOCARBAMOL 500 MG PO TABS: 500.0000 mg | ORAL_TABLET | Freq: Three times a day (TID) | ORAL | 0 refills | Status: DC | PRN

## 2017-09-09 MED ORDER — TRAMADOL HCL 50 MG PO TABS: 50.0000 mg | ORAL_TABLET | Freq: Three times a day (TID) | ORAL | 0 refills | Status: DC | PRN

## 2017-09-09 NOTE — Assessment & Plan Note (Signed)
Story/exam most consistent with this. Discussed anticipated course of resolution. Rx robaxin, tramadol for breakthrough pain, continue ibuprofen, discussed ice/heating pad use.  Offered xray to r/o flail chest - pt declines due to cost concerns.  Red flags to seek further care reviewed. Pt agrees with plan.

## 2017-09-09 NOTE — Patient Instructions (Addendum)
I think you had bad rib strain that will take time to heal (can take 6 wks). Heating pad to the area, may use ibuprofen as needed, muscle relaxant sent to pharmacy.  May take tramadol for breakthrough pain, mainly at night time.  Let us know if any worsening despite treatment, or if not improving with above.
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
## 2017-09-16 ENCOUNTER — Other Ambulatory Visit: Payer: Self-pay | Admitting: Family Medicine

## 2017-09-17 NOTE — Telephone Encounter (Signed)
Sent.  If not any better, then needs recheck. Thanks.

## 2017-09-17 NOTE — Telephone Encounter (Signed)
Pt calling to f/up on this request.  °

## 2017-09-17 NOTE — Telephone Encounter (Signed)
Pharmacy requests refill on Tramadol  Last OV: 09/09/17 *for rib sprain Next OV: none scheduled Last Refill: patient prescribed acutely on 09/09/17 #15, 0 refills.  Will forward to Dr. Para Marchuncan to advise in Dr. Timoteo ExposeG's absence.

## 2017-09-18 NOTE — Telephone Encounter (Signed)
Notified patient.  He says he is getting better but still dealing with am difficulty.  He feels that 1 more week of med and he should be fine and thanks us for the call.

## 2018-04-23 IMAGING — DX DG CHEST 2V
2 series · 2 of 2 positions shown · non-contrast
Comparison: 10/26/10

CLINICAL DATA: Cough for 3 months.

EXAM:
CHEST  2 VIEW

[chest pa]
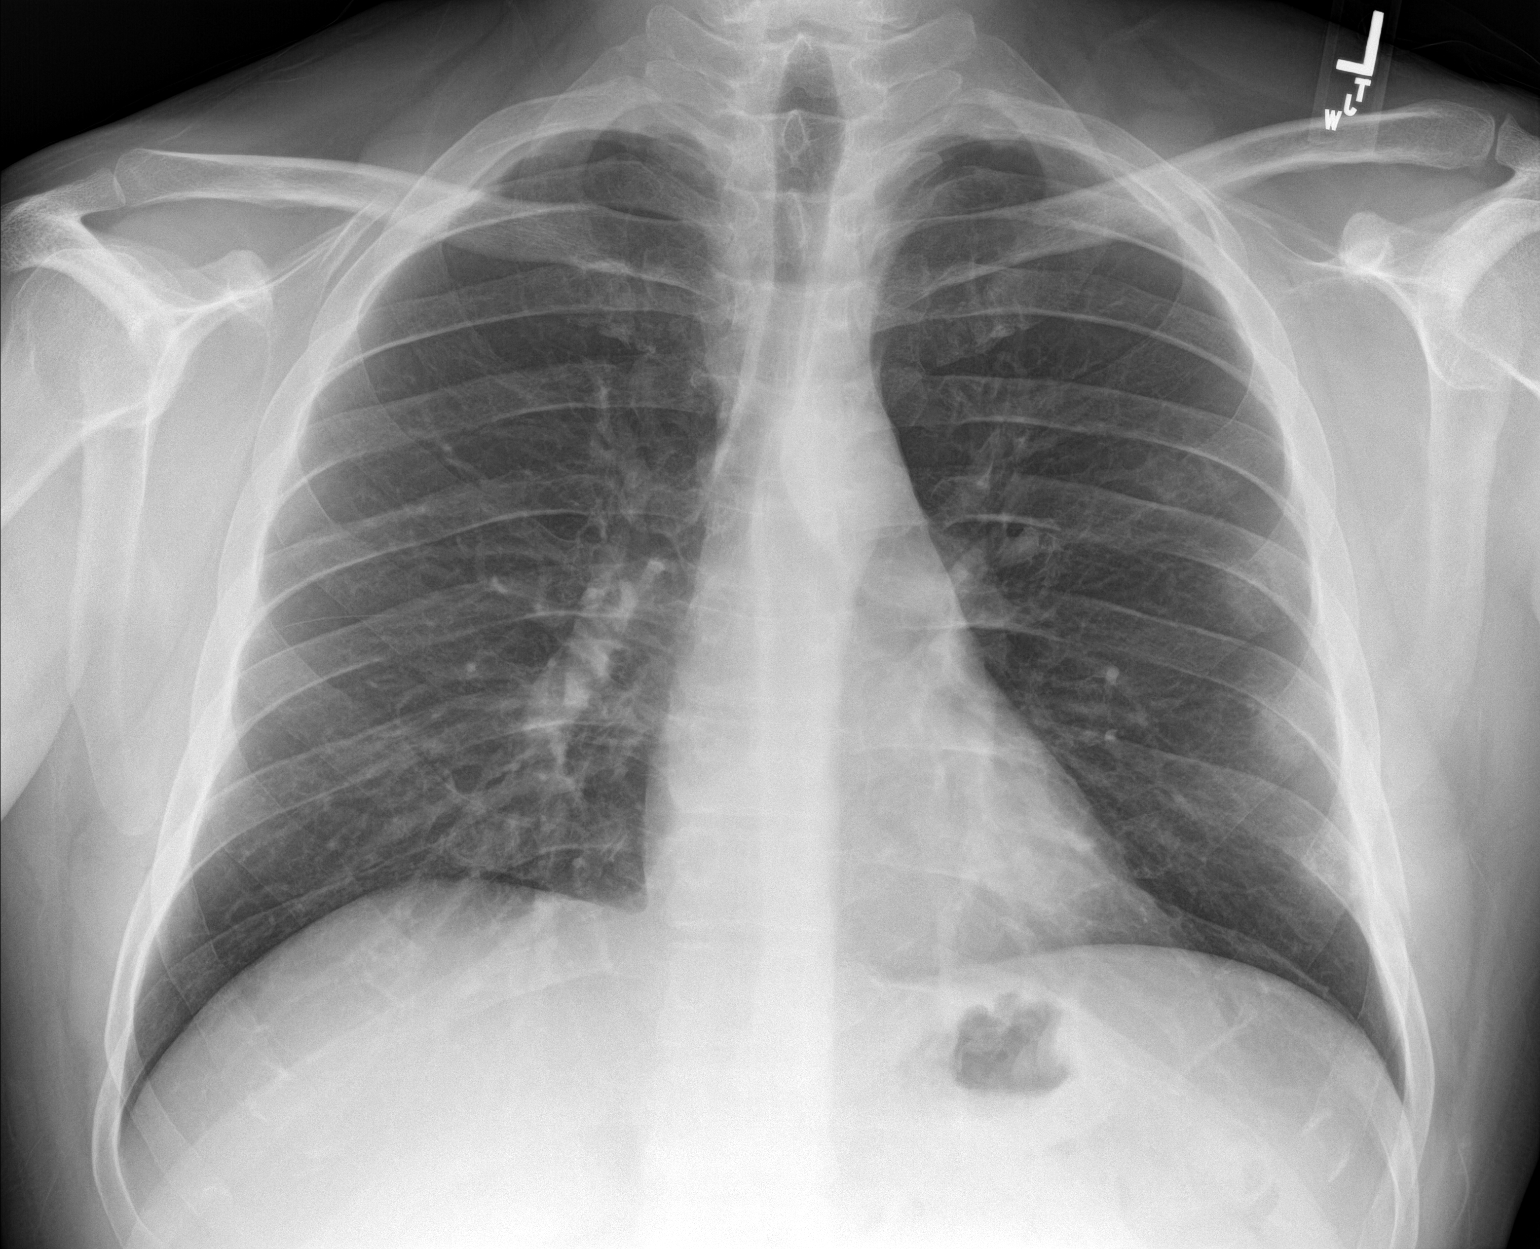

[chest lat]
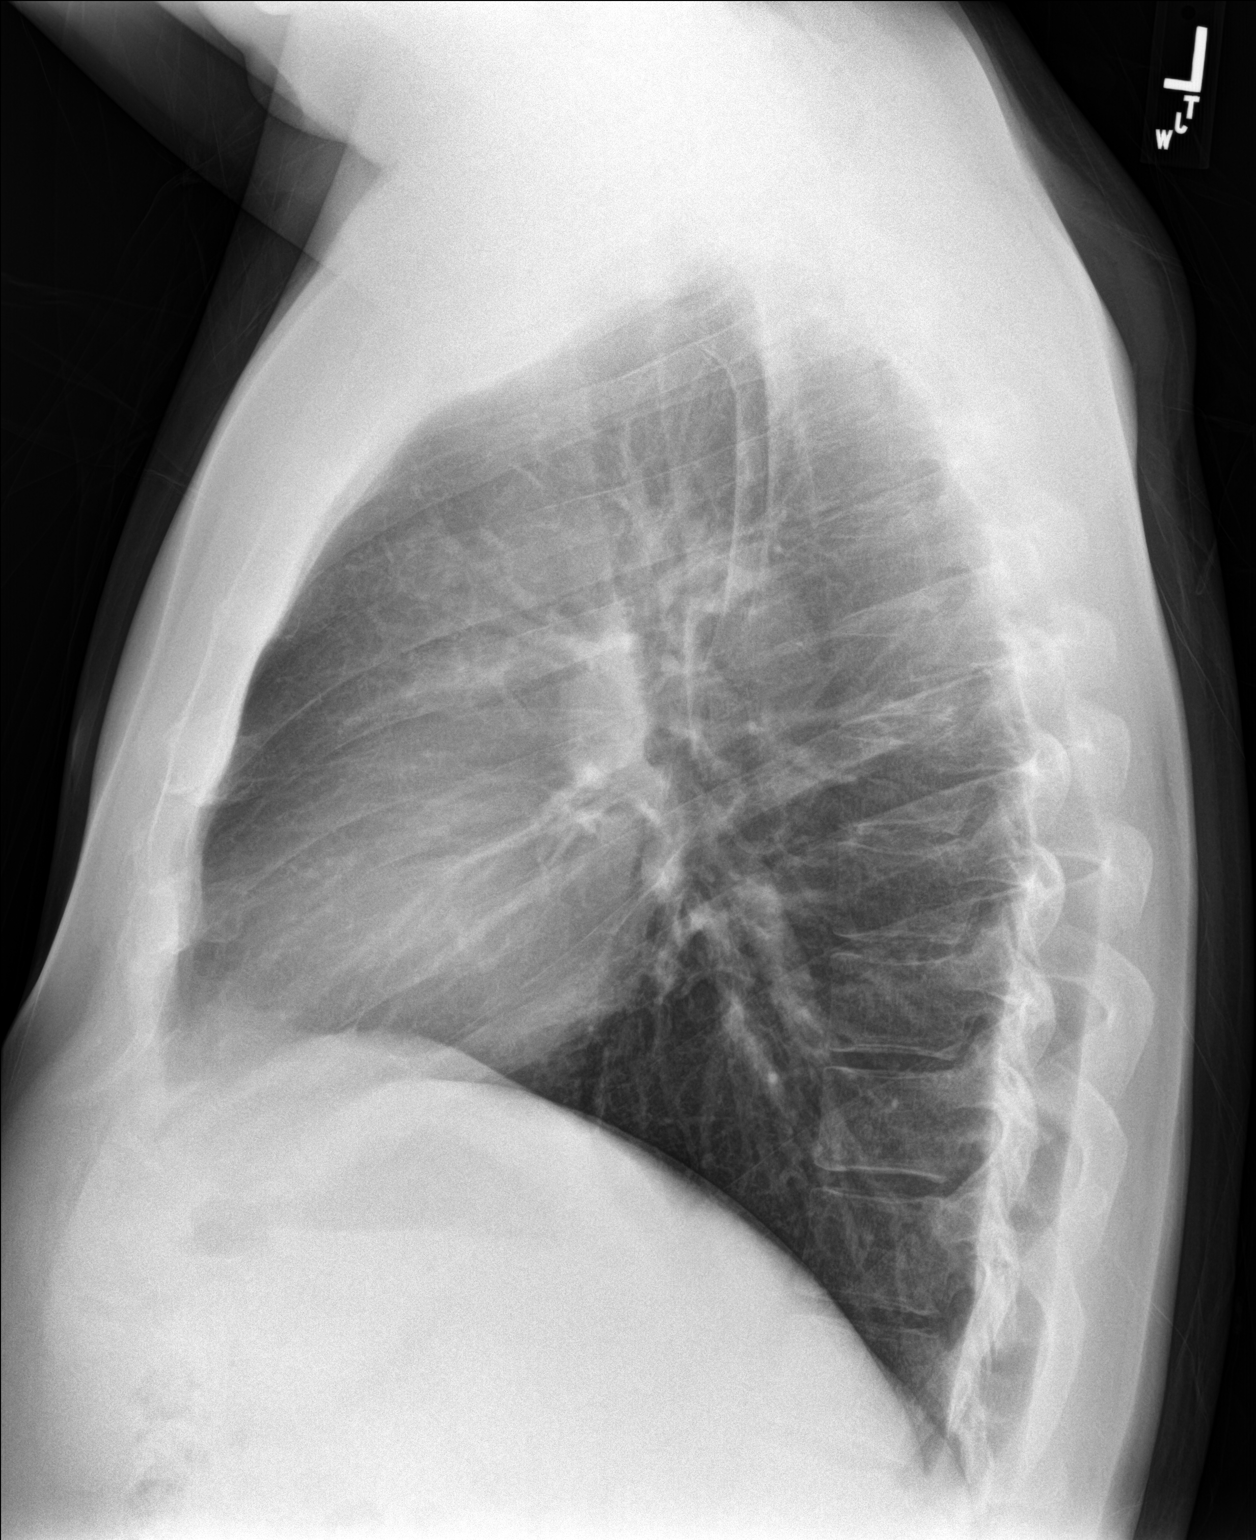

[2 of 2 positions shown; findings below may reference images not displayed]

FINDINGS: Normal heart size. No pleural effusion or edema. No airspace
opacities identified. Age indeterminate left anterior fourth fifth
and sixth rib fractures.
IMPRESSION: No evidence for pneumonia.

Age-indeterminate left anterior rib fractures.

## 2018-07-04 ENCOUNTER — Telehealth: Payer: Self-pay

## 2018-07-04 MED ORDER — METHOCARBAMOL 500 MG PO TABS: 500.0000 mg | ORAL_TABLET | Freq: Three times a day (TID) | ORAL | 0 refills | Status: DC | PRN

## 2018-07-04 MED ORDER — TRAMADOL HCL 50 MG PO TABS: 50.0000 mg | ORAL_TABLET | Freq: Two times a day (BID) | ORAL | 0 refills | Status: DC | PRN
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
## 2018-07-04 NOTE — Telephone Encounter (Signed)
plz notify I've sent in robaxin muscle relaxant and tramadol for breakthrough pain, use with sedation precautions. May use ibuprofen 600mg  with meals for inflammation/pain.  If ongoing pain, not improving with treatment, rec in-OV for eval .

## 2018-07-04 NOTE — Telephone Encounter (Signed)
Spoke with pt relaying Dr. G's instructions and message.  Pt verbalizes understanding.  
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
## 2018-09-03 ENCOUNTER — Other Ambulatory Visit: Payer: Self-pay | Admitting: Family Medicine

## 2018-09-04 NOTE — Telephone Encounter (Signed)
Name of Medication: pantoprazole 40 mg Name of Pharmacy:[facility] or Written Date and Quantity: # 30 x 1 on 08/19/17 Last Office Visit and Type: 09/09/17 acute;no recent annual visit Next Office Visit and Type: none scheduled  Please advise.

## 2018-10-02 ENCOUNTER — Other Ambulatory Visit: Payer: Self-pay | Admitting: Family Medicine

## 2018-10-02 NOTE — Telephone Encounter (Signed)
lvm for pt to call back and schedule cpe and labs

## 2018-10-02 NOTE — Telephone Encounter (Signed)
E-scribed refill.  Plz schedule CPE and labs. 

## 2018-10-10 NOTE — Telephone Encounter (Signed)
lvm 2nd attempt

## 2018-10-16 NOTE — Telephone Encounter (Signed)
Received fax from pharmacy stating insurance will only cover 90 day supply. Ok to resend this way?

## 2018-10-18 MED ORDER — PANTOPRAZOLE SODIUM 40 MG PO TBEC: 40.0000 mg | DELAYED_RELEASE_TABLET | Freq: Every day | ORAL | 0 refills | Status: DC

## 2018-10-18 NOTE — Addendum Note (Signed)
Addended by: Ria Bush on: 10/18/2018 12:28 PM   Modules accepted: Orders

## 2018-12-26 ENCOUNTER — Encounter: Payer: Self-pay | Admitting: Family Medicine

## 2018-12-26 NOTE — Telephone Encounter (Signed)
Mailed letter 12/26/18 °

## 2018-12-28 DIAGNOSIS — U071 COVID-19: Secondary | ICD-10-CM

## 2018-12-28 HISTORY — DX: COVID-19: U07.1

## 2019-01-07 ENCOUNTER — Other Ambulatory Visit: Payer: Self-pay

## 2019-01-07 DIAGNOSIS — Z20822 Contact with and (suspected) exposure to covid-19: Secondary | ICD-10-CM

## 2019-01-09 ENCOUNTER — Telehealth: Payer: Self-pay

## 2019-01-09 LAB — NOVEL CORONAVIRUS, NAA: SARS-CoV-2, NAA: NOT DETECTED

## 2019-01-09 NOTE — Telephone Encounter (Signed)
Spoke with patient. Overall feeling well but does have common cold symptoms - cough, aches, drainage.  Tested negative 01/07/2019 but his GF tested positive - both have same symptoms.  [facility]  Would treat as presumed positive.  Recommend self isolation for at least 10 days from symptom onset (01/07/2019), and until fever free for 24 hours and symptoms improving.  plz call Monday for update on symptoms and to see what [facility] test result was.
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
## 2019-01-12 NOTE — Telephone Encounter (Addendum)
Spoke with pt for an update on sxs.  States he feels a lot better.  Only sxs he c/o is still no sense of taste/smell.  States he was given self isolation recommendation during call with Dr. Darnell Level on 01/09/19.

## 2019-01-12 NOTE — Telephone Encounter (Signed)
Left message on vm per dpr asking pt for results of [facility] test with [facility]

## 2019-01-26 ENCOUNTER — Other Ambulatory Visit: Payer: Self-pay | Admitting: Family Medicine

## 2019-01-27 NOTE — Telephone Encounter (Signed)
Patient has returned phone call and has been scheduled for CPE & labs. Thanks!

## 2019-01-27 NOTE — Telephone Encounter (Signed)
E-scribed refill.  Pls schedule cpe and lab visits. 

## 2019-01-27 NOTE — Telephone Encounter (Signed)
lvm for pt to call us back and schedule cpe/labs

## 2019-01-28 ENCOUNTER — Other Ambulatory Visit: Payer: Self-pay

## 2019-01-28 DIAGNOSIS — Z20822 Contact with and (suspected) exposure to covid-19: Secondary | ICD-10-CM

## 2019-01-31 LAB — NOVEL CORONAVIRUS, NAA: SARS-CoV-2, NAA: NOT DETECTED

## 2019-03-22 ENCOUNTER — Other Ambulatory Visit: Payer: Self-pay | Admitting: Family Medicine

## 2019-03-22 DIAGNOSIS — E785 Hyperlipidemia, unspecified: Secondary | ICD-10-CM

## 2019-03-24 ENCOUNTER — Other Ambulatory Visit (INDEPENDENT_AMBULATORY_CARE_PROVIDER_SITE_OTHER): Payer: BC Managed Care – PPO

## 2019-03-24 ENCOUNTER — Other Ambulatory Visit: Payer: Self-pay

## 2019-03-24 DIAGNOSIS — E785 Hyperlipidemia, unspecified: Secondary | ICD-10-CM | POA: Diagnosis not present

## 2019-03-24 LAB — COMPREHENSIVE METABOLIC PANEL
ALT: 40 U/L (ref 0–53)
AST: 25 U/L (ref 0–37)
Albumin: 4.5 g/dL (ref 3.5–5.2)
Alkaline Phosphatase: 56 U/L (ref 39–117)
BUN: 17 mg/dL (ref 6–23)
CO2: 31 mEq/L (ref 19–32)
Calcium: 9.9 mg/dL (ref 8.4–10.5)
Chloride: 100 mEq/L (ref 96–112)
Creatinine, Ser: 0.99 mg/dL (ref 0.40–1.50)
GFR: 81.51 mL/min (ref >60.00)
Glucose, Bld: 103 mg/dL — ABNORMAL HIGH (ref 70–99)
Potassium: 4.4 mEq/L (ref 3.5–5.1)
Sodium: 138 mEq/L (ref 135–145)
Total Bilirubin: 0.9 mg/dL (ref 0.2–1.2)
Total Protein: 7.3 g/dL (ref 6.0–8.3)

## 2019-03-24 LAB — TSH: TSH: 2.94 u[IU]/mL (ref 0.35–4.50)

## 2019-03-24 LAB — LIPID PANEL
Cholesterol: 249 mg/dL — ABNORMAL HIGH (ref 0–200)
HDL: 61 mg/dL (ref >39.00)
LDL Cholesterol: 172 mg/dL — ABNORMAL HIGH (ref 0–99)
NonHDL: 187.71
Total CHOL/HDL Ratio: 4
Triglycerides: 77 mg/dL (ref 0.0–149.0)
VLDL: 15.4 mg/dL (ref 0.0–40.0)

## 2019-03-31 ENCOUNTER — Ambulatory Visit (INDEPENDENT_AMBULATORY_CARE_PROVIDER_SITE_OTHER): Payer: BC Managed Care – PPO | Admitting: Family Medicine

## 2019-03-31 ENCOUNTER — Encounter: Payer: Self-pay | Admitting: Family Medicine

## 2019-03-31 ENCOUNTER — Other Ambulatory Visit: Payer: Self-pay

## 2019-03-31 VITALS — BP 120/72 | HR 97 | Temp 97.6°F | Ht 70.5 in | Wt 209.6 lb

## 2019-03-31 DIAGNOSIS — Z0000 Encounter for general adult medical examination without abnormal findings: Secondary | ICD-10-CM

## 2019-03-31 DIAGNOSIS — Z1211 Encounter for screening for malignant neoplasm of colon: Secondary | ICD-10-CM

## 2019-03-31 DIAGNOSIS — F439 Reaction to severe stress, unspecified: Secondary | ICD-10-CM | POA: Insufficient documentation

## 2019-03-31 DIAGNOSIS — E785 Hyperlipidemia, unspecified: Secondary | ICD-10-CM | POA: Insufficient documentation

## 2019-03-31 DIAGNOSIS — K219 Gastro-esophageal reflux disease without esophagitis: Secondary | ICD-10-CM

## 2019-03-31 DIAGNOSIS — E7800 Pure hypercholesterolemia, unspecified: Secondary | ICD-10-CM

## 2019-03-31 MED ORDER — PANTOPRAZOLE SODIUM 40 MG PO TBEC: 40.0000 mg | DELAYED_RELEASE_TABLET | Freq: Every day | ORAL | 3 refills | Status: DC

## 2019-03-31 MED ORDER — HYDROXYZINE HCL 25 MG PO TABS: 12.5000 mg | ORAL_TABLET | Freq: Two times a day (BID) | ORAL | 1 refills | Status: DC | PRN

## 2019-03-31 NOTE — Patient Instructions (Addendum)
Pass by lab to pick up stool kit.  Try decreasing pantoprazole to every other day dosing - want to find least amt of med to do the job.  Work on low cholesterol diet and returning to regular exercise routine.  Return as needed or in 1 year for next physical.   Health Maintenance, Male Adopting a healthy lifestyle and getting preventive care are important in promoting health and wellness. Ask your health care provider about:  The right schedule for you to have regular tests and exams.  Things you can do on your own to prevent diseases and keep yourself healthy. What should I know about diet, weight, and exercise? Eat a healthy diet   Eat a diet that includes plenty of vegetables, fruits, low-fat dairy products, and lean protein.  Do not eat a lot of foods that are high in solid fats, added sugars, or sodium. Maintain a healthy weight Body mass index (BMI) is a measurement that can be used to identify possible weight problems. It estimates body fat based on height and weight. Your health care provider can help determine your BMI and help you achieve or maintain a healthy weight. Get regular exercise Get regular exercise. This is one of the most important things you can do for your health. Most adults should:  Exercise for at least 150 minutes each week. The exercise should increase your heart rate and make you sweat (moderate-intensity exercise).  Do strengthening exercises at least twice a week. This is in addition to the moderate-intensity exercise.  Spend less time sitting. Even light physical activity can be beneficial. Watch cholesterol and blood lipids Have your blood tested for lipids and cholesterol at 46 years of age, then have this test every 5 years. You may need to have your cholesterol levels checked more often if:  Your lipid or cholesterol levels are high.  You are older than 46 years of age.  You are at high risk for heart disease. What should I know about cancer  screening? Many types of cancers can be detected early and may often be prevented. Depending on your health history and family history, you may need to have cancer screening at various ages. This may include screening for:  Colorectal cancer.  Prostate cancer.  Skin cancer.  Lung cancer. What should I know about heart disease, diabetes, and high blood pressure? Blood pressure and heart disease  High blood pressure causes heart disease and increases the risk of stroke. This is more likely to develop in people who have high blood pressure readings, are of African descent, or are overweight.  Talk with your health care provider about your target blood pressure readings.  Have your blood pressure checked: ? Every 3-5 years if you are 66-62 years of age. ? Every year if you are 17 years old or older.  If you are between the ages of 32 and 38 and are a current or former smoker, ask your health care provider if you should have a one-time screening for abdominal aortic aneurysm (AAA). Diabetes Have regular diabetes screenings. This checks your fasting blood sugar level. Have the screening done:  Once every three years after age 3 if you are at a normal weight and have a low risk for diabetes.  More often and at a younger age if you are overweight or have a high risk for diabetes. What should I know about preventing infection? Hepatitis B If you have a higher risk for hepatitis B, you should be screened for  this virus. Talk with your health care provider to find out if you are at risk for hepatitis B infection. Hepatitis C Blood testing is recommended for:  Everyone born from [birth year] through 1967.  Anyone with known risk factors for hepatitis C. Sexually transmitted infections (STIs)  You should be screened each year for STIs, including gonorrhea and chlamydia, if: ? You are sexually active and are younger than 46 years of age. ? You are older than 46 years of age and your health care  provider tells you that you are at risk for this type of infection. ? Your sexual activity has changed since you were last screened, and you are at increased risk for chlamydia or gonorrhea. Ask your health care provider if you are at risk.  Ask your health care provider about whether you are at high risk for HIV. Your health care provider may recommend a prescription medicine to help prevent HIV infection. If you choose to take medicine to prevent HIV, you should first get tested for HIV. You should then be tested every 3 months for as long as you are taking the medicine. Follow these instructions at home: Lifestyle  Do not use any products that contain nicotine or tobacco, such as cigarettes, e-cigarettes, and chewing tobacco. If you need help quitting, ask your health care provider.  Do not use street drugs.  Do not share needles.  Ask your health care provider for help if you need support or information about quitting drugs. Alcohol use  Do not drink alcohol if your health care provider tells you not to drink.  If you drink alcohol: ? Limit how much you have to 0-2 drinks a day. ? Be aware of how much alcohol is in your drink. In the [place] one drink equals one 12 oz bottle of beer (355 mL), one 5 oz glass of wine (148 mL), or one 1 oz glass of hard liquor (44 mL). General instructions  Schedule regular health, dental, and eye exams.  Stay current with your vaccines.  Tell your health care provider if: ? You often feel depressed. ? You have ever been abused or do not feel safe at home. Summary  Adopting a healthy lifestyle and getting preventive care are important in promoting health and wellness.  Follow your health care provider's instructions about healthy diet, exercising, and getting tested or screened for diseases.  Follow your health care provider's instructions on monitoring your cholesterol and blood pressure. This information is not intended to replace advice given  to you by your health care provider. Make sure you discuss any questions you have with your health care provider. Document Revised: 02/05/2018 Document Reviewed: 02/05/2018 Elsevier Patient Education  2020 [employer].

## 2019-03-31 NOTE — Assessment & Plan Note (Signed)
Increased work stress due to covid pandemic and work insecurity (works in [employer]). Reviewed importance of stress relieving strategies. Pt interested in trial of PRN med for anxiety/sleep - will send in hydroxyzine trial. Update with effect.

## 2019-03-31 NOTE — Assessment & Plan Note (Signed)
Encouraged working on [employer] - recheck next year. Anticipate hereditary component to hyperlipidemia The 10-year ASCVD risk score Denman George DC Montez Hageman., et al., 2013) is: 2.1%   Values used to calculate the score:     Age: 44 years     Sex: Male     Is Non-Hispanic African American: No     Diabetic: No     Tobacco smoker: No     Systolic Blood Pressure: 120 mmHg     Is BP treated: No     HDL Cholesterol: 61 mg/dL     Total Cholesterol: 249 mg/dL

## 2019-03-31 NOTE — Assessment & Plan Note (Addendum)
Preventative protocols reviewed and updated unless pt declined. Discussed healthy diet and lifestyle.  Reviewed new [place] PSTF guidelines - he agrees to iFOB.

## 2019-03-31 NOTE — Progress Notes (Signed)
This visit was conducted in person.  BP 120/72 (BP Location: Left Arm, Patient Position: Sitting, Cuff Size: Normal)   Pulse 97   Temp 97.6 F (36.4 C) (Temporal)   Ht 5' 10.5" (1.791 m)   Wt 209 lb 9 oz (95.1 kg)   SpO2 96%   BMI 29.64 kg/m    CC: CPE Subjective:    Patient ID: Isaiah Mullins, male    DOB: 1975/11/04, 44 y.o.   MRN: [id number]  HPI: Isaiah Mullins is a 44 y.o. male presenting on 03/31/2019 for Annual Exam   Decreased physical activity due to weather - enjoys trail riding.  Increased stress due to covid. Increased financial stressors, noticing increasing anxiety.   GERD - on protonix 16m daily. States EGD normal in the past. Taking Goli gummies (apple cider vinegar) regularly.  Covid 12/2018 - fully resolved.  Preventative: Colon cancer screening - discussed, would like iFOB. Flu shot - declines  Tetanus - ~2007. Tdap declines.  Seat belt use discussed.  Sunscreen when out in sun. No changing moles.  Non smoker Alcohol - 6+ drinks on occasional - encouraged backing down Dentist q6 mo Eye exam has not seen   Caffeine: coffee daily Divorced from wife 2015, son (2003), 2 cats Lives with GF and her children part time Occupation: sPress photographerrep at [employer]  Activity: running, working out 3-4days /wk. Plays golf.  Diet: good water, vegetables daily, working on more fruits     Relevant past medical, surgical, family and social history reviewed and updated as indicated. Interim medical history since our last visit reviewed. Allergies and medications reviewed and updated. Outpatient Medications Prior to Visit  Medication Sig Dispense Refill  . pantoprazole (PROTONIX) 40 MG tablet TAKE 1 TABLET BY MOUTH EVERY DAY 90 tablet 0  . methocarbamol (ROBAXIN) 500 MG tablet Take 1-2 tablets (500-1,000 mg total) by mouth 3 (three) times daily as needed for muscle spasms. 40 tablet 0  . traMADol (ULTRAM) 50 MG tablet Take 1 tablet (50 mg total) by mouth 2 (two)  times daily as needed for moderate pain. 10 tablet 0   No facility-administered medications prior to visit.     Per HPI unless specifically indicated in ROS section below Review of Systems  Constitutional: Negative for activity change, appetite change, chills, fatigue, fever and unexpected weight change.  HENT: Negative for hearing loss.   Eyes: Negative for visual disturbance.  Respiratory: Negative for cough, chest tightness, shortness of breath and wheezing.   Cardiovascular: Negative for chest pain, palpitations and leg swelling.  Gastrointestinal: Negative for abdominal distention, abdominal pain, blood in stool, constipation, diarrhea, nausea and vomiting.  Genitourinary: Negative for difficulty urinating and hematuria.  Musculoskeletal: Negative for arthralgias, myalgias and neck pain.  Skin: Negative for rash.  Neurological: Negative for dizziness, seizures, syncope and headaches.  Hematological: Negative for adenopathy. Does not bruise/bleed easily.  Psychiatric/Behavioral: Negative for dysphoric mood. The patient is nervous/anxious.        Work related   Objective:    BP 120/72 (BP Location: Left Arm, Patient Position: Sitting, Cuff Size: Normal)   Pulse 97   Temp 97.6 F (36.4 C) (Temporal)   Ht 5' 10.5" (1.791 m)   Wt 209 lb 9 oz (95.1 kg)   SpO2 96%   BMI 29.64 kg/m   Wt Readings from Last 3 Encounters:  03/31/19 209 lb 9 oz (95.1 kg)  09/09/17 201 lb 4 oz (91.3 kg)  04/26/17 202 lb (91.6 kg)  Physical Exam Vitals and nursing note reviewed.  Constitutional:      General: He is not in acute distress.    Appearance: Normal appearance. He is well-developed. He is not ill-appearing.  HENT:     Head: Normocephalic and atraumatic.     Right Ear: Hearing, tympanic membrane, ear canal and external ear normal.     Left Ear: Hearing, tympanic membrane, ear canal and external ear normal.     Mouth/Throat:     Pharynx: Uvula midline.  Eyes:     General: No scleral  icterus.    Extraocular Movements: Extraocular movements intact.     Conjunctiva/sclera: Conjunctivae normal.     Pupils: Pupils are equal, round, and reactive to light.  Neck:     Thyroid: Thyromegaly present. No thyroid tenderness.  Cardiovascular:     Rate and Rhythm: Normal rate and regular rhythm.     Pulses: Normal pulses.          Radial pulses are 2+ on the right side and 2+ on the left side.     Heart sounds: Normal heart sounds. No murmur.  Pulmonary:     Effort: Pulmonary effort is normal. No respiratory distress.     Breath sounds: Normal breath sounds. No wheezing, rhonchi or rales.  Abdominal:     General: Abdomen is flat. Bowel sounds are normal. There is no distension.     Palpations: Abdomen is soft. There is no mass.     Tenderness: There is no abdominal tenderness. There is no guarding or rebound.     Hernia: No hernia is present.  Musculoskeletal:        General: Normal range of motion.     Cervical back: Normal range of motion and neck supple.     Right lower leg: No edema.     Left lower leg: No edema.  Lymphadenopathy:     Cervical: No cervical adenopathy.  Skin:    General: Skin is warm and dry.     Findings: No rash.  Neurological:     General: No focal deficit present.     Mental Status: He is alert and oriented to person, place, and time.     Comments: CN grossly intact, station and gait intact  Psychiatric:        Mood and Affect: Mood normal.        Behavior: Behavior normal.        Thought Content: Thought content normal.        Judgment: Judgment normal.       Results for orders placed or performed in visit on 03/24/19  TSH  Result Value Ref Range   TSH 2.94 0.35 - 4.50 uIU/mL  Comprehensive metabolic panel  Result Value Ref Range   Sodium 138 135 - 145 mEq/L   Potassium 4.4 3.5 - 5.1 mEq/L   Chloride 100 96 - 112 mEq/L   CO2 31 19 - 32 mEq/L   Glucose, Bld 103 (H) 70 - 99 mg/dL   BUN 17 6 - 23 mg/dL   Creatinine, Ser 0.99 0.40 -  1.50 mg/dL   Total Bilirubin 0.9 0.2 - 1.2 mg/dL   Alkaline Phosphatase 56 39 - 117 U/L   AST 25 0 - 37 U/L   ALT 40 0 - 53 U/L   Total Protein 7.3 6.0 - 8.3 g/dL   Albumin 4.5 3.5 - 5.2 g/dL   GFR 81.51 >60.00 mL/min   Calcium 9.9 8.4 - 10.5 mg/dL  Lipid  panel  Result Value Ref Range   Cholesterol 249 (H) 0 - 200 mg/dL   Triglycerides 77.0 0.0 - 149.0 mg/dL   HDL 61.00 >39.00 mg/dL   VLDL 15.4 0.0 - 40.0 mg/dL   LDL Cholesterol 172 (H) 0 - 99 mg/dL   Total CHOL/HDL Ratio 4    NonHDL 187.71    Depression screen PHQ 2/9 03/31/2019  Decreased Interest 2  Down, Depressed, Hopeless 2  PHQ - 2 Score 4  Altered sleeping 2  Tired, decreased energy 0  Change in appetite 1  Feeling bad or failure about yourself  1  Trouble concentrating 0  Moving slowly or fidgety/restless 0  Suicidal thoughts 0  PHQ-9 Score 8    GAD 7 : Generalized Anxiety Score 03/31/2019  Nervous, Anxious, on Edge 2  Control/stop worrying 2  Worry too much - different things 2  Trouble relaxing 1  Restless 0  Easily annoyed or irritable 2  Afraid - awful might happen 2  Total GAD 7 Score 11   Assessment & Plan:  This visit occurred during the SARS-CoV-2 public health emergency.  Safety protocols were in place, including screening questions prior to the visit, additional usage of staff PPE, and extensive cleaning of exam room while observing appropriate contact time as indicated for disinfecting solutions.   Problem List Items Addressed This Visit    Stress    Increased work stress due to covid pandemic and work insecurity (works in Press [employer]). Reviewed importance of stress relieving strategies. Pt interested in trial of PRN med for anxiety/sleep - will send in hydroxyzine trial. Update with effect.       Hyperlipidemia    Encouraged working on low chol diet - recheck next year. Anticipate hereditary component to hyperlipidemia The 10-year ASCVD risk score Mikey Bussing DC Brooke Bonito., et al., 2013) is: 2.1%   Values used to  calculate the score:     Age: 53 years     Sex: Male     Is Non-Hispanic African American: No     Diabetic: No     Tobacco smoker: No     Systolic Blood Pressure: 361 mmHg     Is BP treated: No     HDL Cholesterol: 61 mg/dL     Total Cholesterol: 249 mg/dL       Health care maintenance - Primary    Preventative protocols reviewed and updated unless pt declined. Discussed healthy diet and lifestyle.  Reviewed new [place] PSTF guidelines - he agrees to iFOB.       GERD (gastroesophageal reflux disease)    Chronic, stable period on pantoprazole 82m daily - reviewed risks of long term use, suggested try QOD dosing. No EGD in system.       Relevant Medications   pantoprazole (PROTONIX) 40 MG tablet    Other Visit Diagnoses    Special screening for malignant neoplasms, colon       Relevant Orders   Fecal occult blood, imunochemical       Meds ordered this encounter  Medications  . pantoprazole (PROTONIX) 40 MG tablet    Sig: Take 1 tablet (40 mg total) by mouth daily.    Dispense:  90 tablet    Refill:  3  . hydrOXYzine (ATARAX/VISTARIL) 25 MG tablet    Sig: Take 0.5-1 tablets (12.5-25 mg total) by mouth 2 (two) times daily as needed for anxiety (sedation precautions).    Dispense:  30 tablet    Refill:  1   Orders Placed  This Encounter  Procedures  . Fecal occult blood, imunochemical    Standing Status:   Future    Standing Expiration Date:   03/30/2020    Patient instructions: Pass by lab to pick up stool kit.  Try decreasing pantoprazole to every other day dosing - want to find least amt of med to do the job.  Work on low cholesterol diet and returning to regular exercise routine.  Return as needed or in 1 year for next physical.   Follow up plan: Return in about 1 year (around 03/30/2020) for annual exam, prior fasting for blood work.  Ria Bush, MD

## 2019-03-31 NOTE — Assessment & Plan Note (Addendum)
Chronic, stable period on pantoprazole 40mg  daily - reviewed risks of long term use, suggested try QOD dosing. No EGD in system.

## 2019-04-07 ENCOUNTER — Other Ambulatory Visit: Payer: Self-pay | Admitting: Family Medicine

## 2020-06-22 ENCOUNTER — Other Ambulatory Visit: Payer: Self-pay | Admitting: Family Medicine

## 2020-06-22 NOTE — Telephone Encounter (Signed)
Please call patient and schedule annual exam then send back for refill after scheduled.

## 2020-06-23 NOTE — Telephone Encounter (Signed)
Patient is scheduled EM 

## 2020-08-13 ENCOUNTER — Other Ambulatory Visit: Payer: Self-pay | Admitting: Family Medicine

## 2020-08-13 DIAGNOSIS — E7800 Pure hypercholesterolemia, unspecified: Secondary | ICD-10-CM

## 2020-08-17 ENCOUNTER — Other Ambulatory Visit (INDEPENDENT_AMBULATORY_CARE_PROVIDER_SITE_OTHER): Payer: BC Managed Care – PPO

## 2020-08-17 ENCOUNTER — Other Ambulatory Visit: Payer: Self-pay

## 2020-08-17 DIAGNOSIS — E7800 Pure hypercholesterolemia, unspecified: Secondary | ICD-10-CM | POA: Diagnosis not present

## 2020-08-17 LAB — BASIC METABOLIC PANEL
BUN: 17 mg/dL (ref 6–23)
CO2: 27 mEq/L (ref 19–32)
Calcium: 9.7 mg/dL (ref 8.4–10.5)
Chloride: 102 mEq/L (ref 96–112)
Creatinine, Ser: 0.95 mg/dL (ref 0.40–1.50)
GFR: 95.63 mL/min (ref >60.00)
Glucose, Bld: 93 mg/dL (ref 70–99)
Potassium: 4.1 mEq/L (ref 3.5–5.1)
Sodium: 137 mEq/L (ref 135–145)

## 2020-08-17 LAB — LIPID PANEL
Cholesterol: 245 mg/dL — ABNORMAL HIGH (ref 0–200)
HDL: 56.2 mg/dL (ref >39.00)
LDL Cholesterol: 161 mg/dL — ABNORMAL HIGH (ref 0–99)
NonHDL: 188.46
Total CHOL/HDL Ratio: 4
Triglycerides: 136 mg/dL (ref 0.0–149.0)
VLDL: 27.2 mg/dL (ref 0.0–40.0)

## 2020-08-24 ENCOUNTER — Other Ambulatory Visit: Payer: Self-pay

## 2020-08-24 ENCOUNTER — Ambulatory Visit (INDEPENDENT_AMBULATORY_CARE_PROVIDER_SITE_OTHER): Payer: BC Managed Care – PPO | Admitting: Family Medicine

## 2020-08-24 ENCOUNTER — Encounter: Payer: Self-pay | Admitting: Family Medicine

## 2020-08-24 VITALS — BP 122/82 | HR 90 | Temp 97.6°F | Ht 70.0 in | Wt 209.3 lb

## 2020-08-24 DIAGNOSIS — Z0000 Encounter for general adult medical examination without abnormal findings: Secondary | ICD-10-CM | POA: Diagnosis not present

## 2020-08-24 DIAGNOSIS — E7800 Pure hypercholesterolemia, unspecified: Secondary | ICD-10-CM | POA: Diagnosis not present

## 2020-08-24 DIAGNOSIS — R21 Rash and other nonspecific skin eruption: Secondary | ICD-10-CM

## 2020-08-24 DIAGNOSIS — K219 Gastro-esophageal reflux disease without esophagitis: Secondary | ICD-10-CM

## 2020-08-24 DIAGNOSIS — Z1211 Encounter for screening for malignant neoplasm of colon: Secondary | ICD-10-CM | POA: Diagnosis not present

## 2020-08-24 MED ORDER — NYSTATIN 100000 UNIT/GM EX CREA: 1.0000 "application " | TOPICAL_CREAM | Freq: Two times a day (BID) | CUTANEOUS | 0 refills | Status: DC

## 2020-08-24 NOTE — Progress Notes (Signed)
Patient ID: Isaiah Mullins, male    DOB: June 17, 1975, 45 y.o.   MRN: [id number]  This visit was conducted in person.  BP 122/82   Pulse 90   Temp 97.6 F (36.4 C) (Temporal)   Ht _0  (1.778 m)   Wt 209 lb 5 oz (94.9 kg)   SpO2 98%   BMI 30.03 kg/m    CC: CPE Subjective:   HPI: Isaiah Mullins is a 45 y.o. male presenting on 08/24/2020 for Annual Exam   GERD - on protonix 43m daily. States EGD normal in the past.   Red rash to axilla - tried GF's OTC ultra healing lotion with hydrocodone with improvement.    Preventative: Colon cancer screening - discussed, would like iFOB. Never completed last year.  Flu shot - declines  COVID vaccine J&J 06/2018  Tetanus - ~2007. Tdap declines.  Seat belt use discussed. Sunscreen when out in sun. No changing moles.  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 Plays golf.  Diet: good water, vegetables daily, working on more fruits     Relevant past medical, surgical, family and social history reviewed and updated as indicated. Interim medical history since our last visit reviewed. Allergies and medications reviewed and updated. Outpatient Medications Prior to Visit  Medication Sig Dispense Refill   pantoprazole (PROTONIX) 40 MG tablet TAKE 1 TABLET BY MOUTH EVERY DAY 90 tablet 0   hydrOXYzine (ATARAX/VISTARIL) 25 MG tablet Take 0.5-1 tablets (12.5-25 mg total) by mouth 2 (two) times daily as needed for anxiety (sedation precautions). 30 tablet 1   No facility-administered medications prior to visit.     Per HPI unless specifically indicated in ROS section below Review of Systems  Constitutional:  Negative for activity change, appetite change, chills, fatigue, fever  and unexpected weight change.  HENT:  Negative for hearing loss.   Eyes:  Negative for visual disturbance.  Respiratory:  Negative for cough, chest tightness, shortness of breath and wheezing.   Cardiovascular:  Negative for chest pain, palpitations and leg swelling.  Gastrointestinal:  Negative for abdominal distention, abdominal pain, blood in stool, constipation, diarrhea, nausea and vomiting.  Genitourinary:  Negative for difficulty urinating and hematuria.  Musculoskeletal:  Negative for arthralgias, myalgias and neck pain.  Skin:  Negative for rash.  Neurological:  Negative for dizziness, seizures, syncope and headaches.  Hematological:  Negative for adenopathy. Does not bruise/bleed easily.  Psychiatric/Behavioral:  Negative for dysphoric mood. The patient is not nervous/anxious.    Objective:  BP 122/82   Pulse 90   Temp 97.6 F (36.4 C) (Temporal)   Ht _1  (1.778 m)   Wt 209 lb 5 oz (94.9 kg)   SpO2 98%   BMI 30.03 kg/m   Wt Readings from Last 3 Encounters:  08/24/20 209 lb 5 oz (94.9 kg)  03/31/19 209 lb 9 oz (95.1 kg)  09/09/17 201 lb 4 oz (91.3 kg)      Physical Exam Vitals and nursing note reviewed.  Constitutional:      General: He is not in acute distress.    Appearance: Normal appearance. He is well-developed. He is not ill-appearing.  HENT:     Head: Normocephalic and atraumatic.  Right Ear: Hearing, tympanic membrane, ear canal and external ear normal.     Left Ear: Hearing, tympanic membrane, ear canal and external ear normal.  Eyes:     General: No scleral icterus.    Extraocular Movements: Extraocular movements intact.     Conjunctiva/sclera: Conjunctivae normal.     Pupils: Pupils are equal, round, and reactive to light.  Neck:     Thyroid: No thyroid mass or thyromegaly.  Cardiovascular:     Rate and Rhythm: Normal rate and regular rhythm.     Pulses: Normal pulses.          Radial pulses are 2+ on the right side and 2+ on the left side.      Heart sounds: Normal heart sounds. No murmur heard. Pulmonary:     Effort: Pulmonary effort is normal. No respiratory distress.     Breath sounds: Normal breath sounds. No wheezing, rhonchi or rales.  Abdominal:     General: Bowel sounds are normal. There is no distension.     Palpations: Abdomen is soft. There is no mass.     Tenderness: There is no abdominal tenderness. There is no guarding or rebound.     Hernia: No hernia is present.  Musculoskeletal:        General: Normal range of motion.     Cervical back: Normal range of motion and neck supple.     Right lower leg: No edema.     Left lower leg: No edema.  Lymphadenopathy:     Cervical: No cervical adenopathy.  Skin:    General: Skin is warm and dry.     Findings: Erythema and rash present.     Comments: Erythematous rash to right axilla  Neurological:     General: No focal deficit present.     Mental Status: He is alert and oriented to person, place, and time.  Psychiatric:        Mood and Affect: Mood normal.        Behavior: Behavior normal.        Thought Content: Thought content normal.        Judgment: Judgment normal.      Results for orders placed or performed in visit on 76/73/41  Basic metabolic panel  Result Value Ref Range   Sodium 137 135 - 145 mEq/L   Potassium 4.1 3.5 - 5.1 mEq/L   Chloride 102 96 - 112 mEq/L   CO2 27 19 - 32 mEq/L   Glucose, Bld 93 70 - 99 mg/dL   BUN 17 6 - 23 mg/dL   Creatinine, Ser 0.95 0.40 - 1.50 mg/dL   GFR 95.63 >60.00 mL/min   Calcium 9.7 8.4 - 10.5 mg/dL  Lipid panel  Result Value Ref Range   Cholesterol 245 (H) 0 - 200 mg/dL   Triglycerides 136.0 0.0 - 149.0 mg/dL   HDL 56.20 >39.00 mg/dL   VLDL 27.2 0.0 - 40.0 mg/dL   LDL Cholesterol 161 (H) 0 - 99 mg/dL   Total CHOL/HDL Ratio 4    NonHDL 188.46     Assessment & Plan:  This visit occurred during the SARS-CoV-2 public health emergency.  Safety protocols were in place, including screening questions prior to the  visit, additional usage of staff PPE, and extensive cleaning of exam room while observing appropriate contact time as indicated for disinfecting solutions.   Problem List Items Addressed This Visit     Health care maintenance - Primary    Preventative protocols  reviewed and updated unless pt declined. Discussed healthy diet and lifestyle.        GERD (gastroesophageal reflux disease)    Chronic, continue daily PPI.        Relevant Medications   pantoprazole (PROTONIX) 40 MG tablet   Hyperlipidemia    Encouraged healthy diet choices to help control cholesterol levels. Will mail low chol diet handout.  The 10-year ASCVD risk score Mikey Bussing DC Brooke Bonito., et al., 2013) is: 2.8%   Values used to calculate the score:     Age: 43 years     Sex: Male     Is Non-Hispanic African American: No     Diabetic: No     Tobacco smoker: No     Systolic Blood Pressure: 494 mmHg     Is BP treated: No     HDL Cholesterol: 56.2 mg/dL     Total Cholesterol: 245 mg/dL        Skin rash    Alternating between axillae  Heat rash vs candidal rash - Rx nystatin cream, update with effect.        Other Visit Diagnoses     Special screening for malignant neoplasms, colon       Relevant Orders   Fecal occult blood, imunochemical        Meds ordered this encounter  Medications   nystatin cream (MYCOSTATIN)    Sig: Apply 1 application topically 2 (two) times daily.    Dispense:  30 g    Refill:  0   pantoprazole (PROTONIX) 40 MG tablet    Sig: Take 1 tablet (40 mg total) by mouth daily.    Dispense:  90 tablet    Refill:  3    Orders Placed This Encounter  Procedures   Fecal occult blood, imunochemical    Standing Status:   Future    Standing Expiration Date:   08/24/2021    Patient instructions: Pass by lab to pick up stool kit.  Consider updating tetanus shot  Work on low cholesterol diet to help control cholesterol levels Try nystatin cream sent to pharmacy for underarm rash, if not  helpful, may continue the ultra healing lotion.  Return as needed or in 1 year for next physical.   Follow up plan: No follow-ups on file.  Ria Bush, MD

## 2020-08-24 NOTE — Patient Instructions (Addendum)
Pass by lab to pick up stool kit.  Consider updating tetanus shot  Work on low cholesterol diet to help control cholesterol levels Try nystatin cream sent to pharmacy for underarm rash, if not helpful, may continue the ultra healing lotion.  Return as needed or in 1 year for next physical.   Health Maintenance, Male Adopting a healthy lifestyle and getting preventive care are important in promoting health and wellness. Ask your health care provider about: The right schedule for you to have regular tests and exams. Things you can do on your own to prevent diseases and keep yourself healthy. What should I know about diet, weight, and exercise? Eat a healthy diet  Eat a diet that includes plenty of vegetables, fruits, low-fat dairy products, and lean protein. Do not eat a lot of foods that are high in solid fats, added sugars, or sodium.  Maintain a healthy weight Body mass index (BMI) is a measurement that can be used to identify possible weight problems. It estimates body fat based on height and weight. Your health care provider can help determine your BMI and help you achieve or maintain ahealthy weight. Get regular exercise Get regular exercise. This is one of the most important things you can do for your health. Most adults should: Exercise for at least 150 minutes each week. The exercise should increase your heart rate and make you sweat (moderate-intensity exercise). Do strengthening exercises at least twice a week. This is in addition to the moderate-intensity exercise. Spend less time sitting. Even light physical activity can be beneficial. Watch cholesterol and blood lipids Have your blood tested for lipids and cholesterol at 47 years of age, then havethis test every 5 years. You may need to have your cholesterol levels checked more often if: Your lipid or cholesterol levels are high. You are older than 47 years of age. You are at high risk for heart disease. What should I know  about cancer screening? Many types of cancers can be detected early and may often be prevented. Depending on your health history and family history, you may need to have cancer screening at various ages. This may include screening for: Colorectal cancer. Prostate cancer. Skin cancer. Lung cancer. What should I know about heart disease, diabetes, and high blood pressure? Blood pressure and heart disease High blood pressure causes heart disease and increases the risk of stroke. This is more likely to develop in people who have high blood pressure readings, are of African descent, or are overweight. Talk with your health care provider about your target blood pressure readings. Have your blood pressure checked: Every 3-5 years if you are 13-50 years of age. Every year if you are 80 years old or older. If you are between the ages of 5 and 66 and are a current or former smoker, ask your health care provider if you should have a one-time screening for abdominal aortic aneurysm (AAA). Diabetes Have regular diabetes screenings. This checks your fasting blood sugar level. Have the screening done: Once every three years after age 18 if you are at a normal weight and have a low risk for diabetes. More often and at a younger age if you are overweight or have a high risk for diabetes. What should I know about preventing infection? Hepatitis B If you have a higher risk for hepatitis B, you should be screened for this virus. Talk with your health care provider to find out if you are at risk forhepatitis B infection. Hepatitis C Blood  testing is recommended for: Everyone born from [birth year] through 1967. Anyone with known risk factors for hepatitis C. Sexually transmitted infections (STIs) You should be screened each year for STIs, including gonorrhea and chlamydia, if: You are sexually active and are younger than 47 years of age. You are older than 47 years of age and your health care provider tells you that  you are at risk for this type of infection. Your sexual activity has changed since you were last screened, and you are at increased risk for chlamydia or gonorrhea. Ask your health care provider if you are at risk. Ask your health care provider about whether you are at high risk for HIV. Your health care provider may recommend a prescription medicine to help prevent HIV infection. If you choose to take medicine to prevent HIV, you should first get tested for HIV. You should then be tested every 3 months for as long as you are taking the medicine. Follow these instructions at home: Lifestyle Do not use any products that contain nicotine or tobacco, such as cigarettes, e-cigarettes, and chewing tobacco. If you need help quitting, ask your health care provider. Do not use street drugs. Do not share needles. Ask your health care provider for help if you need support or information about quitting drugs. Alcohol use Do not drink alcohol if your health care provider tells you not to drink. If you drink alcohol: Limit how much you have to 0-2 drinks a day. Be aware of how much alcohol is in your drink. In the [place] one drink equals one 12 oz bottle of beer (355 mL), one 5 oz glass of wine (148 mL), or one 1 oz glass of hard liquor (44 mL). General instructions Schedule regular health, dental, and eye exams. Stay current with your vaccines. Tell your health care provider if: You often feel depressed. You have ever been abused or do not feel safe at home. Summary Adopting a healthy lifestyle and getting preventive care are important in promoting health and wellness. Follow your health care provider's instructions about healthy diet, exercising, and getting tested or screened for diseases. Follow your health care provider's instructions on monitoring your cholesterol and blood pressure. This information is not intended to replace advice given to you by your health care provider. Make sure you discuss  any questions you have with your healthcare provider. Document Revised: 02/05/2018 Document Reviewed: 02/05/2018 Elsevier Patient Education  2022 [employer].

## 2020-08-25 ENCOUNTER — Encounter: Payer: Self-pay | Admitting: Family Medicine

## 2020-08-25 DIAGNOSIS — R21 Rash and other nonspecific skin eruption: Secondary | ICD-10-CM | POA: Insufficient documentation

## 2020-08-25 MED ORDER — PANTOPRAZOLE SODIUM 40 MG PO TBEC: 40.0000 mg | DELAYED_RELEASE_TABLET | Freq: Every day | ORAL | 3 refills | Status: DC

## 2020-08-25 NOTE — Assessment & Plan Note (Signed)
Chronic, continue daily PPI.

## 2020-08-25 NOTE — Assessment & Plan Note (Signed)
Alternating between axillae  Heat rash vs candidal rash - Rx nystatin cream, update with effect.

## 2020-08-25 NOTE — Assessment & Plan Note (Signed)
Encouraged healthy diet choices to help control cholesterol levels. Will mail low chol diet handout.  The 10-year ASCVD risk score Denman George DC Montez Hageman., et al., 2013) is: 2.8%   Values used to calculate the score:     Age: 45 years     Sex: Male     Is Non-Hispanic African American: No     Diabetic: No     Tobacco smoker: No     Systolic Blood Pressure: 122 mmHg     Is BP treated: No     HDL Cholesterol: 56.2 mg/dL     Total Cholesterol: 245 mg/dL

## 2020-08-25 NOTE — Assessment & Plan Note (Signed)
Preventative protocols reviewed and updated unless pt declined. Discussed healthy diet and lifestyle.  

## 2021-02-09 ENCOUNTER — Other Ambulatory Visit: Payer: Self-pay | Admitting: Family Medicine

## 2021-03-30 ENCOUNTER — Telehealth: Payer: BC Managed Care – PPO | Admitting: Family Medicine

## 2021-03-30 ENCOUNTER — Other Ambulatory Visit: Payer: Self-pay

## 2021-05-04 ENCOUNTER — Other Ambulatory Visit: Payer: Self-pay

## 2021-05-04 ENCOUNTER — Encounter: Payer: Self-pay | Admitting: Family

## 2021-05-04 ENCOUNTER — Telehealth (INDEPENDENT_AMBULATORY_CARE_PROVIDER_SITE_OTHER): Payer: BC Managed Care – PPO | Admitting: Family

## 2021-05-04 VITALS — Temp 97.5°F | Ht 70.0 in | Wt 205.0 lb

## 2021-05-04 DIAGNOSIS — J40 Bronchitis, not specified as acute or chronic: Secondary | ICD-10-CM | POA: Insufficient documentation

## 2021-05-04 DIAGNOSIS — R051 Acute cough: Secondary | ICD-10-CM | POA: Insufficient documentation

## 2021-05-04 MED ORDER — AZITHROMYCIN 250 MG PO TABS: ORAL_TABLET | ORAL | 0 refills | Status: AC

## 2021-05-04 MED ORDER — PREDNISONE 20 MG PO TABS: ORAL_TABLET | ORAL | 0 refills | Status: DC

## 2021-05-04 NOTE — Assessment & Plan Note (Signed)
rx for zpack and prednisone taper sent to pharmacy for relief of symptoms ?Also suggest otc flonase daily  ?Take antibiotic as prescribed.  ?Increase oral fluids. Pt to f/u if sx worsen and or fail to improve in 2-3 days. ? ?

## 2021-05-04 NOTE — Assessment & Plan Note (Addendum)
Steroid pack, delsyn otc  ?Also suggested if no improvement may also consider GERD to see if this causing dry non productive cough ?

## 2021-05-04 NOTE — Progress Notes (Signed)
? ? ?MyChart Video Visit ? ? ? ?Virtual Visit via Video Note  ? ?This visit type was conducted due to national recommendations for restrictions regarding the COVID-19 Pandemic (e.g. social distancing) in an effort to limit this patient's exposure and mitigate transmission in our community. This patient is at least at moderate risk for complications without adequate follow up. This format is felt to be most appropriate for this patient at this time. Physical exam was limited by quality of the video and audio technology used for the visit. CMA was able to get the patient set up on a video visit. ? ?Patient location: Home. Patient and provider in visit ?Provider location: Office ? ?I discussed the limitations of evaluation and management by telemedicine and the availability of in person appointments. The patient expressed understanding and agreed to proceed. ? ?Visit Date: 05/04/2021 ? ?Today's healthcare provider: Mort Sawyersabitha Latoria Dry, FNP  ? ? ? ?Subjective:  ? ? Patient ID: Isaiah Mullins, male    DOB: 07/05/1975, 46 y.o.   MRN: [id number] ? ?Chief Complaint  ?Patient presents with  ? Cough  ?  Had COVID about 3.5 wks ago. Cough improved but returned 3-5 days ago.  Sleeps with CPAP and cough calms down but worsens once getting up.  Also c/o some nasal drainage.    ? ? ?HPI ? ?Pt here with concerns via video visit.  ?Had covid about 3.5 weeks ago, only felt bad about one day but then a few days after developed some dry non productive cough with some chest congestion. He states harder to wear CPAP because coughs often with it. Still with some nasal congestion only slight, but some post nasal drip worse in the am.  ? ?No fever no chills.  ?No sore throat.  ? ?Still taking protonix for GERD, 95% of the time good for heartburn. But he has to watch what he eats, and avoid pizza or saucy type foods otherwise heartburn will worsen.  ? ? ?Past Medical History:  ?Diagnosis Date  ? COVID-19 virus infection 12/2018  ? GERD  (gastroesophageal reflux disease)   ? History of chicken pox   ? History of kidney stones 01/2009  ? attributed to tums  ? ? ?Past Surgical History:  ?Procedure Laterality Date  ? NASAL SINUS SURGERY  1990s  ? deviated septum  ? ? ?Family History  ?Problem Relation Age of Onset  ? Hyperlipidemia Father   ? Coronary artery disease Neg Hx   ? Stroke Neg Hx   ? Diabetes Neg Hx   ? Cancer Neg Hx   ? ? ?Social History  ? ?Socioeconomic History  ? Marital status: Married  ?  Spouse name: Not on file  ? Number of children: Not on file  ? Years of education: Not on file  ? Highest education level: Not on file  ?Occupational History  ? Not on file  ?Tobacco Use  ? Smoking status: Never  ? Smokeless tobacco: Never  ?Vaping Use  ? Vaping Use: Never used  ?Substance and Sexual Activity  ? Alcohol use: Yes  ?  Comment: social  ? Drug use: No  ? Sexual activity: Yes  ?  Partners: Female  ?Other Topics Concern  ? Not on file  ?Social History Narrative  ? Caffeine: some sweet tea, coffee but not daily  ? Divorced from wife 2015, son (2003), 2 cats  ? Occupation: Tax advisersales rep at [employer]   ? Activity: running, working out 3-4days /wk.  Plays golf  ?  Diet: good water, vegetables daily, working on more fruits, currently on no wheat diet, red meat 2x/wk, fish 2x/wk  ? ?Social Determinants of Health  ? ?Financial Resource Strain: Not on file  ?Food Insecurity: Not on file  ?Transportation Needs: Not on file  ?Physical Activity: Not on file  ?Stress: Not on file  ?Social Connections: Not on file  ?Intimate Partner Violence: Not on file  ? ? ?Outpatient Medications Prior to Visit  ?Medication Sig Dispense Refill  ? nystatin cream (MYCOSTATIN) Apply 1 application topically 2 (two) times daily. (Patient taking differently: Apply 1 application. topically 2 (two) times daily. As needed) 30 g 0  ? pantoprazole (PROTONIX) 40 MG tablet TAKE 1 TABLET BY MOUTH EVERY DAY 90 tablet 1  ? ?No facility-administered medications prior to visit.   ? ? ?Allergies  ?Allergen Reactions  ? Amoxicillin Rash  ? ? ?Review of Systems  ?Constitutional:  Negative for chills and fever.  ?HENT:  Positive for congestion and postnasal drip. Negative for ear pain, sinus pain and sore throat.   ?Respiratory:  Positive for cough and chest tightness. Negative for shortness of breath and wheezing.   ?Cardiovascular:  Negative for chest pain and palpitations.  ? ?   ?Objective:  ?  ?Physical Exam ?Constitutional:   ?   Appearance: Normal appearance.  ?Pulmonary:  ?   Effort: Pulmonary effort is normal.  ?Neurological:  ?   General: No focal deficit present.  ?   Mental Status: He is alert and oriented to person, place, and time.  ?Psychiatric:     ?   Mood and Affect: Mood normal.     ?   Behavior: Behavior normal.     ?   Thought Content: Thought content normal.  ? ? ?Temp (!) 97.5 ?F (36.4 ?C)   Ht 5\' 10"  (1.778 m)   Wt 205 lb (93 kg)   BMI 29.41 kg/m?  ?Wt Readings from Last 3 Encounters:  ?05/04/21 205 lb (93 kg)  ?08/24/20 209 lb 5 oz (94.9 kg)  ?03/31/19 209 lb 9 oz (95.1 kg)  ? ? ?   ?Assessment & Plan:  ? ?Problem List Items Addressed This Visit   ? ?  ? Respiratory  ? Bronchitis  ?  rx for zpack and prednisone taper sent to pharmacy for relief of symptoms ?Also suggest otc flonase daily  ?Take antibiotic as prescribed.  ?Increase oral fluids. Pt to f/u if sx worsen and or fail to improve in 2-3 days. ? ?  ?  ? Relevant Medications  ? azithromycin (ZITHROMAX) 250 MG tablet  ?  ? Other  ? Acute cough - Primary  ?  Steroid pack, delsyn otc  ?Also suggested if no improvement may also consider GERD to see if this causing dry non productive cough ?  ?  ? Relevant Medications  ? predniSONE (DELTASONE) 20 MG tablet  ? ? ?I am having 05/29/19 start on azithromycin and predniSONE. I am also having him maintain his nystatin cream and pantoprazole. ? ?Meds ordered this encounter  ?Medications  ? azithromycin (ZITHROMAX) 250 MG tablet  ?  Sig: Take 2 tablets on day 1,  then 1 tablet daily on days 2 through 5  ?  Dispense:  6 tablet  ?  Refill:  0  ?  Order Specific Question:   Supervising Provider  ?  Answer:   BEDSOLE, AMY E [[id number]]  ? predniSONE (DELTASONE) 20 MG tablet  ?  Sig: Take two  tablets daily for 3 days followed by one tablet daily for 4 days  ?  Dispense:  10 tablet  ?  Refill:  0  ?  Order Specific Question:   Supervising Provider  ?  Answer:   BEDSOLE, AMY E [[id number]]  ? ? ?I discussed the assessment and treatment plan with the patient. The patient was provided an opportunity to ask questions and all were answered. The patient agreed with the plan and demonstrated an understanding of the instructions. ?  ?The patient was advised to call back or seek an in-person evaluation if the symptoms worsen or if the condition fails to improve as anticipated. ? ?I provided 15 minutes of face-to-face time during this encounter. ? ? ?Mort Sawyers, FNP ?Nature conservation officer at [facility] ?[phone] (phone) ?[phone] (fax) ? ?[facility]  ?

## 2021-06-08 ENCOUNTER — Telehealth: Payer: Self-pay | Admitting: Family Medicine

## 2021-06-08 MED ORDER — PANTOPRAZOLE SODIUM 40 MG PO TBEC: 40.0000 mg | DELAYED_RELEASE_TABLET | Freq: Every day | ORAL | 0 refills | Status: DC

## 2021-06-08 NOTE — Telephone Encounter (Signed)
E-scribed refill 

## 2021-06-08 NOTE — Telephone Encounter (Signed)
?  Encourage patient to contact the pharmacy for refills or they can request refills through [facility] ? ?LAST APPOINTMENT DATE:  Please schedule appointment if longer than 1 year ? ?NEXT APPOINTMENT DATE: ? ?MEDICATION:pantoprazole (PROTONIX) 40 MG tablet ? ?Is the patient out of medication?  ? ?PHARMACY: [facility] [address] ? ?Let patient know to contact pharmacy at the end of the day to make sure medication is ready. ? ?Please notify patient to allow 48-72 hours to process ? ?CLINICAL FILLS OUT ALL BELOW:  ? ?LAST REFILL: ? ?QTY: ? ?REFILL DATE: ? ? ? ?OTHER COMMENTS:  ? ? ?Okay for refill? ? ?Please advise ? ? ?  ?

## 2021-06-08 NOTE — Addendum Note (Signed)
Addended by: Brenton Grills on: [id number] PM ? ? Modules accepted: Orders ? ?

## 2021-09-28 ENCOUNTER — Other Ambulatory Visit: Payer: Self-pay | Admitting: Family Medicine

## 2021-09-28 NOTE — Telephone Encounter (Signed)
E-scribed refill.  Plz schedule CPE and lab visits for additional refills.  

## 2021-12-31 ENCOUNTER — Other Ambulatory Visit: Payer: Self-pay | Admitting: Family Medicine

## 2021-12-31 DIAGNOSIS — Z1159 Encounter for screening for other viral diseases: Secondary | ICD-10-CM

## 2021-12-31 DIAGNOSIS — E7800 Pure hypercholesterolemia, unspecified: Secondary | ICD-10-CM

## 2022-01-01 ENCOUNTER — Other Ambulatory Visit: Payer: Self-pay | Admitting: Family Medicine

## 2022-01-01 DIAGNOSIS — K219 Gastro-esophageal reflux disease without esophagitis: Secondary | ICD-10-CM

## 2022-01-02 ENCOUNTER — Other Ambulatory Visit: Payer: BC Managed Care – PPO

## 2022-01-09 ENCOUNTER — Ambulatory Visit (INDEPENDENT_AMBULATORY_CARE_PROVIDER_SITE_OTHER): Payer: BC Managed Care – PPO | Admitting: Family Medicine

## 2022-01-09 ENCOUNTER — Encounter: Payer: Self-pay | Admitting: Family Medicine

## 2022-01-09 ENCOUNTER — Other Ambulatory Visit (INDEPENDENT_AMBULATORY_CARE_PROVIDER_SITE_OTHER): Payer: BC Managed Care – PPO | Admitting: *Deleted

## 2022-01-09 VITALS — BP 130/84 | HR 90 | Temp 97.5°F | Ht 70.0 in | Wt 211.2 lb

## 2022-01-09 DIAGNOSIS — H6123 Impacted cerumen, bilateral: Secondary | ICD-10-CM

## 2022-01-09 DIAGNOSIS — Z0000 Encounter for general adult medical examination without abnormal findings: Secondary | ICD-10-CM

## 2022-01-09 DIAGNOSIS — K219 Gastro-esophageal reflux disease without esophagitis: Secondary | ICD-10-CM

## 2022-01-09 DIAGNOSIS — E7800 Pure hypercholesterolemia, unspecified: Secondary | ICD-10-CM | POA: Diagnosis not present

## 2022-01-09 DIAGNOSIS — Z1211 Encounter for screening for malignant neoplasm of colon: Secondary | ICD-10-CM

## 2022-01-09 DIAGNOSIS — H6120 Impacted cerumen, unspecified ear: Secondary | ICD-10-CM | POA: Insufficient documentation

## 2022-01-09 DIAGNOSIS — Z1159 Encounter for screening for other viral diseases: Secondary | ICD-10-CM | POA: Diagnosis not present

## 2022-01-09 DIAGNOSIS — G4733 Obstructive sleep apnea (adult) (pediatric): Secondary | ICD-10-CM

## 2022-01-09 LAB — COMPREHENSIVE METABOLIC PANEL
ALT: 51 U/L (ref 0–53)
AST: 30 U/L (ref 0–37)
Albumin: 4.8 g/dL (ref 3.5–5.2)
Alkaline Phosphatase: 57 U/L (ref 39–117)
BUN: 14 mg/dL (ref 6–23)
CO2: 33 mEq/L — ABNORMAL HIGH (ref 19–32)
Calcium: 10.2 mg/dL (ref 8.4–10.5)
Chloride: 98 mEq/L (ref 96–112)
Creatinine, Ser: 0.81 mg/dL (ref 0.40–1.50)
GFR: 104.32 mL/min (ref >60.00)
Glucose, Bld: 93 mg/dL (ref 70–99)
Potassium: 3.9 mEq/L (ref 3.5–5.1)
Sodium: 137 mEq/L (ref 135–145)
Total Bilirubin: 1 mg/dL (ref 0.2–1.2)
Total Protein: 7.6 g/dL (ref 6.0–8.3)

## 2022-01-09 LAB — LIPID PANEL
Cholesterol: 253 mg/dL — ABNORMAL HIGH (ref 0–200)
HDL: 62.7 mg/dL (ref >39.00)
LDL Cholesterol: 169 mg/dL — ABNORMAL HIGH (ref 0–99)
NonHDL: 190.26
Total CHOL/HDL Ratio: 4
Triglycerides: 106 mg/dL (ref 0.0–149.0)
VLDL: 21.2 mg/dL (ref 0.0–40.0)

## 2022-01-09 LAB — FECAL OCCULT BLOOD, IMMUNOCHEMICAL: Fecal Occult Bld: NEGATIVE

## 2022-01-09 MED ORDER — PANTOPRAZOLE SODIUM 40 MG PO TBEC: 40.0000 mg | DELAYED_RELEASE_TABLET | Freq: Every day | ORAL | 3 refills | Status: DC

## 2022-01-09 NOTE — Assessment & Plan Note (Addendum)
Off statin. Update FLP. The 10-year ASCVD risk score (Arnett DK, et al., 2019) is: 3.5%   Values used to calculate the score:     Age: 46 years     Sex: Male     Is Non-Hispanic African American: No     Diabetic: No     Tobacco smoker: No     Systolic Blood Pressure: 130 mmHg     Is BP treated: No     HDL Cholesterol: 56.2 mg/dL     Total Cholesterol: 245 mg/dL

## 2022-01-09 NOTE — Assessment & Plan Note (Addendum)
S/p unsuccessful bilateral irrigation. Reviewed options including ENT referral - he will continue home treatment.

## 2022-01-09 NOTE — Patient Instructions (Addendum)
Labs today.  Schedule eye exam.  Consider elevating head of bed. Avoidance of citrus, fatty foods, chocolate, peppermint, and excessive alcohol, along with sodas, orange juice (acidic drinks) At least a few hours between dinner and bed, minimize naps after eating. We will refer you to for longstanding history of reflux on heartburn medicine. May also use pepcid (famotidine) at night as needed for breakthrough heartburn.  Ear irrigation bilaterally today.  Good to see you today  Return as needed or in 1 year for next physical.   Health Maintenance, Male Adopting a healthy lifestyle and getting preventive care are important in promoting health and wellness. Ask your health care provider about: The right schedule for you to have regular tests and exams. Things you can do on your own to prevent diseases and keep yourself healthy. What should I know about diet, weight, and exercise? Eat a healthy diet  Eat a diet that includes plenty of vegetables, fruits, low-fat dairy products, and lean protein. Do not eat a lot of foods that are high in solid fats, added sugars, or sodium. Maintain a healthy weight Body mass index (BMI) is a measurement that can be used to identify possible weight problems. It estimates body fat based on height and weight. Your health care provider can help determine your BMI and help you achieve or maintain a healthy weight. Get regular exercise Get regular exercise. This is one of the most important things you can do for your health. Most adults should: Exercise for at least 150 minutes each week. The exercise should increase your heart rate and make you sweat (moderate-intensity exercise). Do strengthening exercises at least twice a week. This is in addition to the moderate-intensity exercise. Spend less time sitting. Even light physical activity can be beneficial. Watch cholesterol and blood lipids Have your blood tested for lipids and cholesterol at 49 years of age,  then have this test every 5 years. You may need to have your cholesterol levels checked more often if: Your lipid or cholesterol levels are high. You are older than 48 years of age. You are at high risk for heart disease. What should I know about cancer screening? Many types of cancers can be detected early and may often be prevented. Depending on your health history and family history, you may need to have cancer screening at various ages. This may include screening for: Colorectal cancer. Prostate cancer. Skin cancer. Lung cancer. What should I know about heart disease, diabetes, and high blood pressure? Blood pressure and heart disease High blood pressure causes heart disease and increases the risk of stroke. This is more likely to develop in people who have high blood pressure readings or are overweight. Talk with your health care provider about your target blood pressure readings. Have your blood pressure checked: Every 3-5 years if you are 80-92 years of age. Every year if you are 20 years old or older. If you are between the ages of 79 and 45 and are a current or former smoker, ask your health care provider if you should have a one-time screening for abdominal aortic aneurysm (AAA). Diabetes Have regular diabetes screenings. This checks your fasting blood sugar level. Have the screening done: Once every three years after age 50 if you are at a normal weight and have a low risk for diabetes. More often and at a younger age if you are overweight or have a high risk for diabetes. What should I know about preventing infection? Hepatitis B If you have  a higher risk for hepatitis B, you should be screened for this virus. Talk with your health care provider to find out if you are at risk for hepatitis B infection. Hepatitis C Blood testing is recommended for: Everyone born from [birth year] through 1967. Anyone with known risk factors for hepatitis C. Sexually transmitted infections (STIs) You  should be screened each year for STIs, including gonorrhea and chlamydia, if: You are sexually active and are younger than 48 years of age. You are older than 48 years of age and your health care provider tells you that you are at risk for this type of infection. Your sexual activity has changed since you were last screened, and you are at increased risk for chlamydia or gonorrhea. Ask your health care provider if you are at risk. Ask your health care provider about whether you are at high risk for HIV. Your health care provider may recommend a prescription medicine to help prevent HIV infection. If you choose to take medicine to prevent HIV, you should first get tested for HIV. You should then be tested every 3 months for as long as you are taking the medicine. Follow these instructions at home: Alcohol use Do not drink alcohol if your health care provider tells you not to drink. If you drink alcohol: Limit how much you have to 0-2 drinks a day. Know how much alcohol is in your drink. In the [place] one drink equals one 12 oz bottle of beer (355 mL), one 5 oz glass of wine (148 mL), or one 1 oz glass of hard liquor (44 mL). Lifestyle Do not use any products that contain nicotine or tobacco. These products include cigarettes, chewing tobacco, and vaping devices, such as e-cigarettes. If you need help quitting, ask your health care provider. Do not use street drugs. Do not share needles. Ask your health care provider for help if you need support or information about quitting drugs. General instructions Schedule regular health, dental, and eye exams. Stay current with your vaccines. Tell your health care provider if: You often feel depressed. You have ever been abused or do not feel safe at home. Summary Adopting a healthy lifestyle and getting preventive care are important in promoting health and wellness. Follow your health care provider's instructions about healthy diet, exercising, and  getting tested or screened for diseases. Follow your health care provider's instructions on monitoring your cholesterol and blood pressure. This information is not intended to replace advice given to you by your health care provider. Make sure you discuss any questions you have with your health care provider. Document Revised: 07/04/2020 Document Reviewed: 07/04/2020 Elsevier Patient Education  2023 [employer].

## 2022-01-09 NOTE — Assessment & Plan Note (Addendum)
Chronic, longstanding on PPI for the past 10 years.  Reviewed dietary/lifestyle GERD precautions. Discussed PPI use, pepcid use, tums use.  No red flags. No prior EGD - given longevity of symptoms, will refer to GI.

## 2022-01-09 NOTE — Assessment & Plan Note (Addendum)
H/o severe sleep apnea by study 2016.  He continues auto-CPAP nightly.  He's been on this several year.  Last saw  pulm 2019 Nicholos Johns)

## 2022-01-09 NOTE — Progress Notes (Signed)
Patient ID: Isaiah Mullins, male    DOB: Dec 01, 1975, 46 y.o.   MRN: [id number]  This visit was conducted in person.  BP 130/84   Pulse 90   Temp (!) 97.5 F (36.4 C) (Temporal)   Ht 5\' 10"  (1.778 m)   Wt 211 lb 3.2 oz (95.8 kg)   SpO2 99%   BMI 30.30 kg/m    CC: CPE Subjective:   HPI: Isaiah Mullins is a 46 y.o. male presenting on 01/09/2022 for Annual Exam   GERD - on protonix 40mg  daily, occasionally skipped doses. Notes pasta sauce triggers this. Both parents have GERD on PPI. On PPI for at least 10 years. Doesn't think he's had EGD. Agrees to GI referral.   OSA, severe by sleep study 2016 - on auto-CPAP with good effect.  Preventative: Colon cancer screening - dropped off iFOB this morning.  Flu shot - declines  COVID vaccine J&J 06/2018  Tetanus - ~2007. Tdap declines.  Seat belt use discussed. Sunscreen when out in sun. No changing moles.  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 Plays golf.  Diet: good water, vegetables daily, working on more fruits     Relevant past medical, surgical, family and social history reviewed and updated as indicated. Interim medical history since our last visit reviewed. Allergies and medications reviewed and updated. Outpatient Medications Prior to Visit  Medication Sig Dispense Refill   nystatin cream (MYCOSTATIN) Apply 1 application topically 2 (two) times daily. (Patient taking differently: Apply 1 application  topically 2 (two) times daily. As needed) 30 g 0   predniSONE (DELTASONE) 20 MG tablet Take two tablets daily for 3 days followed by one tablet daily for 4 days 10 tablet 0    pantoprazole (PROTONIX) 40 MG tablet TAKE ONE TABLET BY MOUTH ONE TIME DAILY 90 tablet 0   No facility-administered medications prior to visit.     Per HPI unless specifically indicated in ROS section below Review of Systems  Constitutional:  Negative for activity change, appetite change, chills, fatigue, fever and unexpected weight change.  HENT:  Negative for hearing loss.   Eyes:  Negative for visual disturbance.  Respiratory:  Negative for cough, chest tightness, shortness of breath and wheezing.   Cardiovascular:  Negative for chest pain, palpitations and leg swelling.  Gastrointestinal:  Negative for abdominal distention, abdominal pain, blood in stool, constipation, diarrhea, nausea and vomiting.  Genitourinary:  Negative for difficulty urinating and hematuria.  Musculoskeletal:  Negative for arthralgias, myalgias and neck pain.  Skin:  Negative for rash.  Neurological:  Negative for dizziness, seizures, syncope and headaches.  Hematological:  Negative for adenopathy. Does not bruise/bleed easily.  Psychiatric/Behavioral:  Negative for dysphoric mood. The patient is not nervous/anxious.     Objective:  BP 130/84   Pulse 90   Temp (!) 97.5 F (36.4 C) (Temporal)   Ht 5\' 10"  (1.778 m)   Wt 211 lb 3.2 oz (95.8 kg)   SpO2 99%   BMI 30.30 kg/m   Wt Readings from Last 3 Encounters:  01/09/22 211 lb 3.2 oz (95.8 kg)  05/04/21 205  lb (93 kg)  08/24/20 209 lb 5 oz (94.9 kg)      Physical Exam Vitals and nursing note reviewed.  Constitutional:      General: He is not in acute distress.    Appearance: Normal appearance. He is well-developed. He is not ill-appearing.  HENT:     Head: Normocephalic and atraumatic.     Right Ear: Hearing, tympanic membrane, ear canal and external ear normal. There is impacted cerumen.     Left Ear: Hearing, tympanic membrane, ear canal and external ear normal. There is impacted cerumen.     Nose: Nose normal.     Mouth/Throat:     Mouth:  Mucous membranes are moist.     Pharynx: Oropharynx is clear. No oropharyngeal exudate or posterior oropharyngeal erythema.  Eyes:     General: No scleral icterus.    Extraocular Movements: Extraocular movements intact.     Conjunctiva/sclera: Conjunctivae normal.     Pupils: Pupils are equal, round, and reactive to light.  Neck:     Thyroid: No thyroid mass or thyromegaly.  Cardiovascular:     Rate and Rhythm: Normal rate and regular rhythm.     Pulses: Normal pulses.          Radial pulses are 2+ on the right side and 2+ on the left side.     Heart sounds: Normal heart sounds. No murmur heard. Pulmonary:     Effort: Pulmonary effort is normal. No respiratory distress.     Breath sounds: Normal breath sounds. No wheezing, rhonchi or rales.  Abdominal:     General: Bowel sounds are normal. There is no distension.     Palpations: Abdomen is soft. There is no mass.     Tenderness: There is no abdominal tenderness. There is no guarding or rebound.     Hernia: No hernia is present.  Musculoskeletal:        General: Normal range of motion.     Cervical back: Normal range of motion and neck supple.     Right lower leg: No edema.     Left lower leg: No edema.  Lymphadenopathy:     Cervical: No cervical adenopathy.  Skin:    General: Skin is warm and dry.     Findings: No rash.  Neurological:     General: No focal deficit present.     Mental Status: He is alert and oriented to person, place, and time.  Psychiatric:        Mood and Affect: Mood normal.        Behavior: Behavior normal.        Thought Content: Thought content normal.        Judgment: Judgment normal.       Results for orders placed or performed in visit on 08/17/20  Basic metabolic panel  Result Value Ref Range   Sodium 137 135 - 145 mEq/L   Potassium 4.1 3.5 - 5.1 mEq/L   Chloride 102 96 - 112 mEq/L   CO2 27 19 - 32 mEq/L   Glucose, Bld 93 70 - 99 mg/dL   BUN 17 6 - 23 mg/dL   Creatinine, Ser 1.61 0.40 -  1.50 mg/dL   GFR 09.60 >45.40 mL/min   Calcium 9.7 8.4 - 10.5 mg/dL  Lipid panel  Result Value Ref Range   Cholesterol 245 (H) 0 - 200 mg/dL   Triglycerides 981.1 0.0 - 149.0 mg/dL   HDL 91.47 >82.95 mg/dL   VLDL 62.1 0.0 -  40.0 mg/dL   LDL Cholesterol 812 (H) 0 - 99 mg/dL   Total CHOL/HDL Ratio 4    NonHDL 188.46     Assessment & Plan:   Problem List Items Addressed This Visit     Health care maintenance - Primary (Chronic)    Preventative protocols reviewed and updated unless pt declined. Discussed healthy diet and lifestyle.  Declines HIV screen.       GERD (gastroesophageal reflux disease)    Chronic, longstanding on PPI for the past 10 years.  Reviewed dietary/lifestyle GERD precautions. Discussed PPI use, pepcid use, tums use.  No red flags. No prior EGD - given longevity of symptoms, will refer to GI.       Relevant Medications   pantoprazole (PROTONIX) 40 MG tablet   Other Relevant Orders   Ambulatory referral to Gastroenterology   OSA (obstructive sleep apnea)    H/o severe sleep apnea by study 2016.  He continues auto-CPAP nightly.  He's been on this several year.  Last saw [facility] 2019 Nicholos Johns)      Hyperlipidemia    Off statin. Update FLP. The 10-year ASCVD risk score (Arnett DK, et al., 2019) is: 3.5%   Values used to calculate the score:     Age: 37 years     Sex: Male     Is Non-Hispanic African American: No     Diabetic: No     Tobacco smoker: No     Systolic Blood Pressure: 130 mmHg     Is BP treated: No     HDL Cholesterol: 56.2 mg/dL     Total Cholesterol: 245 mg/dL       Cerumen impaction    S/p unsuccessful bilateral irrigation. Reviewed options including ENT referral - he will continue home treatment.       Other Visit Diagnoses     Special screening for malignant neoplasms, colon       Relevant Orders   Fecal occult blood, imunochemical   Need for hepatitis C screening test            Meds ordered this  encounter  Medications   pantoprazole (PROTONIX) 40 MG tablet    Sig: Take 1 tablet (40 mg total) by mouth daily.    Dispense:  90 tablet    Refill:  3   Orders Placed This Encounter  Procedures   Fecal occult blood, imunochemical    Standing Status:   Future    Number of Occurrences:   1    Standing Expiration Date:   01/10/2023   Ambulatory referral to Gastroenterology    Referral Priority:   Routine    Referral Type:   Consultation    Referral Reason:   Specialty Services Required    Number of Visits Requested:   1     Patient instructions: Labs today.  Schedule eye exam.  Consider elevating head of bed. Avoidance of citrus, fatty foods, chocolate, peppermint, and excessive alcohol, along with sodas, orange juice (acidic drinks) At least a few hours between dinner and bed, minimize naps after eating. We will refer you to for longstanding history of reflux on heartburn medicine. May also use pepcid (famotidine) at night as needed for breakthrough heartburn.  Ear irrigation bilaterally today.  Good to see you today  Return as needed or in 1 year for next physical.   Follow up plan: Return in about 1 year (around 01/10/2023) for annual exam, prior fasting for blood work.  Eustaquio Boyden, MD

## 2022-01-09 NOTE — Assessment & Plan Note (Addendum)
Preventative protocols reviewed and updated unless pt declined. Discussed healthy diet and lifestyle.  Declines HIV screen.

## 2022-01-10 LAB — HEPATITIS C ANTIBODY: Hepatitis C Ab: NONREACTIVE

## 2022-03-01 ENCOUNTER — Ambulatory Visit: Payer: BC Managed Care – PPO | Admitting: Family Medicine

## 2022-03-05 ENCOUNTER — Ambulatory Visit (INDEPENDENT_AMBULATORY_CARE_PROVIDER_SITE_OTHER): Payer: BC Managed Care – PPO | Admitting: Family Medicine

## 2022-03-05 ENCOUNTER — Encounter: Payer: Self-pay | Admitting: Family Medicine

## 2022-03-05 VITALS — BP 134/88 | HR 88 | Temp 98.7°F | Ht 70.0 in | Wt 209.2 lb

## 2022-03-05 DIAGNOSIS — J22 Unspecified acute lower respiratory infection: Secondary | ICD-10-CM

## 2022-03-05 MED ORDER — AZITHROMYCIN 250 MG PO TABS: ORAL_TABLET | ORAL | 0 refills | Status: DC

## 2022-03-05 MED ORDER — ALBUTEROL SULFATE HFA 108 (90 BASE) MCG/ACT IN AERS: 2.0000 | INHALATION_SPRAY | Freq: Four times a day (QID) | RESPIRATORY_TRACT | 1 refills | Status: DC | PRN

## 2022-03-05 MED ORDER — HYDROCODONE BIT-HOMATROP MBR 5-1.5 MG/5ML PO SOLN: 5.0000 mL | Freq: Three times a day (TID) | ORAL | 0 refills | Status: DC | PRN

## 2022-03-05 NOTE — Patient Instructions (Signed)
You have an acute respiratory infection with cough and congestion. Take azithromycin antibiotic as well as albuterol inhaler as needed and hydrocodone cough syrup mainly for night time. Push fluids and plenty of rest. May continue plain mucinex with plenty of water during the day, ibuprofen for inflammation as needed.  Please return if you are not improving as expected, or if you have high fevers (>101.5) or difficulty swallowing or worsening productive cough. Call clinic with questions.  Good to see you today. I hope you start feeling better soon.

## 2022-03-05 NOTE — Progress Notes (Signed)
Patient ID: Isaiah Mullins, male    DOB: 1976-02-21, 47 y.o.   MRN: [id number]  This visit was conducted in person.  BP 134/88   Pulse 88   Temp 98.7 F (37.1 C)   Ht 5\' 10"  (1.778 m)   Wt 209 lb 4 oz (94.9 kg)   SpO2 98%   BMI 30.02 kg/m    CC: ongoing cough Subjective:   HPI: Isaiah Mullins is a 47 y.o. male presenting on 03/05/2022 for Cough (X 2 weeks Has thrown up mucus all morning. )   2+ wk h/o acute respiratory illness.  Started with malaise, cold chills, fatigue.  Symptoms did largely improve however cough has persisted.  Over last 2-3 days, productive cough has worsened with thick clear mucous.   Started taking friend's doxycycline and prednisone course over the past 3 days.   No wheezing or dyspnea, chest pain, abd pain, nausea.  No sick contacts at home. Non smoker. No COVID test.   Tried OTC decongestant, mucinex.      Relevant past medical, surgical, family and social history reviewed and updated as indicated. Interim medical history since our last visit reviewed. Allergies and medications reviewed and updated. Outpatient Medications Prior to Visit  Medication Sig Dispense Refill   nystatin cream (MYCOSTATIN) Apply 1 application topically 2 (two) times daily. (Patient taking differently: Apply 1 application  topically 2 (two) times daily. As needed) 30 g 0   pantoprazole (PROTONIX) 40 MG tablet Take 1 tablet (40 mg total) by mouth daily. 90 tablet 3   predniSONE (DELTASONE) 20 MG tablet Take two tablets daily for 3 days followed by one tablet daily for 4 days (Patient not taking: Reported on 03/05/2022) 10 tablet 0   No facility-administered medications prior to visit.     Per HPI unless specifically indicated in ROS section below Review of Systems  Objective:  BP 134/88   Pulse 88   Temp 98.7 F (37.1 C)   Ht 5\' 10"  (1.778 m)   Wt 209 lb 4 oz (94.9 kg)   SpO2 98%   BMI 30.02 kg/m   Wt Readings from Last 3 Encounters:  03/05/22 209 lb 4 oz  (94.9 kg)  01/09/22 211 lb 3.2 oz (95.8 kg)  05/04/21 205 lb (93 kg)      Physical Exam Vitals and nursing note reviewed.  Constitutional:      Appearance: Normal appearance. He is not ill-appearing.  HENT:     Head: Normocephalic and atraumatic.     Right Ear: Hearing, ear canal and external ear normal. There is impacted cerumen.     Left Ear: Hearing, ear canal and external ear normal. There is impacted cerumen.     Nose: Nose normal. No mucosal edema, congestion or rhinorrhea.     Right Turbinates: Not enlarged or swollen.     Left Turbinates: Not enlarged or swollen.     Right Sinus: No maxillary sinus tenderness or frontal sinus tenderness.     Left Sinus: No maxillary sinus tenderness or frontal sinus tenderness.     Mouth/Throat:     Mouth: Mucous membranes are moist.     Pharynx: Oropharynx is clear. No oropharyngeal exudate or posterior oropharyngeal erythema.  Eyes:     Extraocular Movements: Extraocular movements intact.     Conjunctiva/sclera: Conjunctivae normal.     Pupils: Pupils are equal, round, and reactive to light.  Cardiovascular:     Rate and Rhythm: Normal rate and regular rhythm.  Pulses: Normal pulses.     Heart sounds: Normal heart sounds. No murmur heard. Pulmonary:     Effort: Pulmonary effort is normal. No respiratory distress.     Breath sounds: Normal breath sounds. No wheezing, rhonchi or rales.     Comments: Persistent cough present throughout visit  Musculoskeletal:     Cervical back: Normal range of motion and neck supple. No rigidity.     Right lower leg: No edema.     Left lower leg: No edema.  Lymphadenopathy:     Cervical: No cervical adenopathy.  Skin:    General: Skin is warm and dry.     Findings: No rash.  Neurological:     Mental Status: He is alert.  Psychiatric:        Mood and Affect: Mood normal.        Behavior: Behavior normal.        Assessment & Plan:   Problem List Items Addressed This Visit     Acute  respiratory infection - Primary    Anticipate acute respiratory infection with possible RAD. Ongoing for 2+ weeks now, cough is worsening. Will Rx azithromycin to cover atypical bronchitis. Rx hycodan for night, Rx albuterol inhaler with administration instructions. Update if not improving with treatment. Pt agrees with plan.  Could consider inhaled corticosteroid PRN respiratory infections.       Relevant Medications   azithromycin (ZITHROMAX) 250 MG tablet     Meds ordered this encounter  Medications   azithromycin (ZITHROMAX) 250 MG tablet    Sig: Take two tablets on day one followed by one tablet on days 2-5    Dispense:  6 each    Refill:  0   albuterol (VENTOLIN HFA) 108 (90 Base) MCG/ACT inhaler    Sig: Inhale 2 puffs into the lungs every 6 (six) hours as needed for wheezing or shortness of breath.    Dispense:  8 g    Refill:  1   HYDROcodone bit-homatropine (HYCODAN) 5-1.5 MG/5ML syrup    Sig: Take 5 mLs by mouth every 8 (eight) hours as needed for cough (sedation precautions).    Dispense:  100 mL    Refill:  0   No orders of the defined types were placed in this encounter.    Patient Instructions  You have an acute respiratory infection with cough and congestion. Take azithromycin antibiotic as well as albuterol inhaler as needed and hydrocodone cough syrup mainly for night time. Push fluids and plenty of rest. May continue plain mucinex with plenty of water during the day, ibuprofen for inflammation as needed.  Please return if you are not improving as expected, or if you have high fevers (>101.5) or difficulty swallowing or worsening productive cough. Call clinic with questions.  Good to see you today. I hope you start feeling better soon.   Follow up plan: Return if symptoms worsen or fail to improve.  Ria Bush, MD

## 2022-03-05 NOTE — Assessment & Plan Note (Addendum)
Anticipate acute respiratory infection with possible RAD. Ongoing for 2+ weeks now, cough is worsening. Will Rx azithromycin to cover atypical bronchitis. Rx hycodan for night, Rx albuterol inhaler with administration instructions. Update if not improving with treatment. Pt agrees with plan.  Could consider inhaled corticosteroid PRN respiratory infections.

## 2022-03-16 ENCOUNTER — Telehealth: Payer: Self-pay | Admitting: Family Medicine

## 2022-03-16 MED ORDER — HYDROCODONE BIT-HOMATROP MBR 5-1.5 MG/5ML PO SOLN: 5.0000 mL | Freq: Three times a day (TID) | ORAL | 0 refills | Status: DC | PRN

## 2022-03-16 NOTE — Telephone Encounter (Signed)
Patient called in and stated that he was seen about 2 weeks ago for a Upper Respiratory Infection. He stated that he took everything he was prescribed and still has a cough. He was wondering if another Z pak or some more cough medicine could be sent in for him. He stated he tried other OTC meds but nothing is helping with the cough. Please advise. Thank you!

## 2022-03-16 NOTE — Addendum Note (Signed)
Addended by: Ria Bush on: 03/16/2022 05:20 PM   Modules accepted: Orders

## 2022-03-16 NOTE — Telephone Encounter (Signed)
Pt called to check on status of med refill. Call back # [phone]

## 2022-03-16 NOTE — Telephone Encounter (Signed)
Spoke to patient by telephone and was advised that he feels just fine. Patient denies a fever or SOB. Patient stated that his cough is dry and non-productive. Patient stated the cough is just driving him crazy at times and OTC cough medication is not helping. Patient stated the prescription cough medication that he was given helped a lot. Patient wants to know if Dr. Danise Mina will send in another prescription for the cough medication. Pharmacy [facility]

## 2022-03-16 NOTE — Telephone Encounter (Signed)
Spoke with patient - will refill cough syrup.

## 2022-03-19 ENCOUNTER — Telehealth: Payer: Self-pay | Admitting: Family Medicine

## 2022-03-19 MED ORDER — HYDROCODONE BIT-HOMATROP MBR 5-1.5 MG/5ML PO SOLN: 5.0000 mL | Freq: Three times a day (TID) | ORAL | 0 refills | Status: DC | PRN

## 2022-03-19 NOTE — Telephone Encounter (Addendum)
New Rx sent to new pharmacy

## 2022-03-19 NOTE — Addendum Note (Signed)
Addended by: Brenton Grills on: 4/91/7915 05:69 AM   Modules accepted: Orders

## 2022-03-19 NOTE — Addendum Note (Signed)
Addended by: Ria Bush on: 03/19/2022 05:15 PM   Modules accepted: Orders

## 2022-03-19 NOTE — Telephone Encounter (Signed)
Patient called in and that [facility] is out of the HYDROcodone bit-homatropine (HYCODAN) 5-1.5 MG/5ML syrup and won't have any back in stock until February. He was wondering if the prescription could be sent over to [id number] - [place], [place] or if something else could be sent in for him. He would like a call when this is done. Thank you!

## 2022-03-20 MED ORDER — HYDROCOD POLI-CHLORPHE POLI ER 10-8 MG/5ML PO SUER: 5.0000 mL | Freq: Two times a day (BID) | ORAL | 0 refills | Status: DC | PRN

## 2022-03-20 NOTE — Telephone Encounter (Signed)
Patient notified as instructed by telephone and verbalized understanding. 

## 2022-03-20 NOTE — Telephone Encounter (Addendum)
Let's try tussionex - which is a bit stronger.  He should call pharmacy to check on availability prior to going, let us know.

## 2022-03-20 NOTE — Telephone Encounter (Signed)
Spoke to patient by telephone and was advised that he went last night and was told that there is a Producer, television/film/video of the cough medication. Patient stated that he is okay with a substitute.

## 2022-03-20 NOTE — Addendum Note (Signed)
Addended by: Ria Bush on: 03/20/2022 08:16 AM   Modules accepted: Orders

## 2022-05-29 ENCOUNTER — Encounter: Payer: Self-pay | Admitting: Gastroenterology

## 2022-05-29 ENCOUNTER — Ambulatory Visit (INDEPENDENT_AMBULATORY_CARE_PROVIDER_SITE_OTHER): Payer: BC Managed Care – PPO | Admitting: Gastroenterology

## 2022-05-29 VITALS — BP 130/86 | HR 87 | Temp 98.6°F | Ht 70.0 in | Wt 209.0 lb

## 2022-05-29 DIAGNOSIS — K219 Gastro-esophageal reflux disease without esophagitis: Secondary | ICD-10-CM

## 2022-05-29 DIAGNOSIS — Z1211 Encounter for screening for malignant neoplasm of colon: Secondary | ICD-10-CM | POA: Diagnosis not present

## 2022-05-29 MED ORDER — NA SULFATE-K SULFATE-MG SULF 17.5-3.13-1.6 GM/177ML PO SOLN: 1.0000 | Freq: Once | ORAL | 0 refills | Status: AC

## 2022-05-29 NOTE — Progress Notes (Signed)
Gastroenterology Consultation  Referring Provider:     Ria Bush, MD Primary Care Physician:  Ria Bush, MD Primary Gastroenterologist:  Dr. Allen Norris     Reason for Consultation:     GERD        HPI:   Isaiah Mullins is a 47 y.o. y/o male referred for consultation & management of GERD by Dr. Ria Bush, MD. This patient comes in with a long history of acid reflux.  He states that he has been weaning off his reflux medication which is pantoprazole since he is on a low-carb low sugar diet and has been losing weight.  The patient has no worry symptoms such as dysphagia.  He also has not had any screening colonoscopy exams nor has he had a Cologuard.  The patient did have a negative Hemoccult card.  There is no report of any unexplained weight loss fevers chills nausea or vomiting.  The patient states that he feels better since losing the weight.  He has been a little more constipated with some twinges of pain since he started the new diet  Past Medical History:  Diagnosis Date   COVID-19 virus infection 12/2018   GERD (gastroesophageal reflux disease)    History of chicken pox    History of kidney stones 01/2009   attributed to tums    Past Surgical History:  Procedure Laterality Date   NASAL SINUS SURGERY  1990s   deviated septum    Prior to Admission medications   Medication Sig Start Date End Date Taking? Authorizing Provider  albuterol (VENTOLIN HFA) 108 (90 Base) MCG/ACT inhaler Inhale 2 puffs into the lungs every 6 (six) hours as needed for wheezing or shortness of breath. 03/05/22   Ria Bush, MD  azithromycin [facility]) 250 MG tablet Take two tablets on day one followed by one tablet on days 2-5 03/05/22   Ria Bush, MD  chlorpheniramine-HYDROcodone (TUSSIONEX) 10-8 MG/5ML Take 5 mLs by mouth every 12 (twelve) hours as needed for cough. 03/20/22   Ria Bush, MD  nystatin cream (MYCOSTATIN) Apply 1 application topically 2 (two) times  daily. Patient taking differently: Apply 1 application  topically 2 (two) times daily. As needed 08/24/20   Ria Bush, MD  pantoprazole (PROTONIX) 40 MG tablet Take 1 tablet (40 mg total) by mouth daily. 01/09/22   Ria Bush, MD    Family History  Problem Relation Age of Onset   Hyperlipidemia Father    Coronary artery disease Neg Hx    Stroke Neg Hx    Diabetes Neg Hx    Cancer Neg Hx      Social History   Tobacco Use   Smoking status: Never   Smokeless tobacco: Never  Vaping Use   Vaping Use: Never used  Substance Use Topics   Alcohol use: Yes    Comment: social   Drug use: No    Allergies as of 05/29/2022 - Review Complete 03/05/2022  Allergen Reaction Noted   Amoxicillin Rash 03/31/2019    Review of Systems:    All systems reviewed and negative except where noted in HPI.   Physical Exam:  There were no vitals taken for this visit. No LMP for male patient. General:   Alert,  Well-developed, well-nourished, pleasant and cooperative in NAD Head:  Normocephalic and atraumatic. Eyes:  Sclera clear, no icterus.   Conjunctiva pink. Ears:  Normal auditory acuity. Neck:  Supple; no masses or thyromegaly. Lungs:  Respirations even and unlabored.  Clear throughout to  auscultation.   No wheezes, crackles, or rhonchi. No acute distress. Heart:  Regular rate and rhythm; no murmurs, clicks, rubs, or gallops. Abdomen:  Normal bowel sounds.  No bruits.  Soft, non-tender and non-distended without masses, hepatosplenomegaly or hernias noted.  No guarding or rebound tenderness.  Negative Carnett sign.   Rectal:  Deferred.  Pulses:  Normal pulses noted. Extremities:  No clubbing or edema.  No cyanosis. Neurologic:  Alert and oriented x3;  grossly normal neurologically. Skin:  Intact without significant lesions or rashes.  No jaundice. Lymph Nodes:  No significant cervical adenopathy. Psych:  Alert and cooperative. Normal mood and affect.  Imaging Studies: No  results found.  Assessment and Plan:   Isaiah Mullins is a 47 y.o. y/o male who comes in today with a history of longstanding heartburn which is getting better while weaning off the medication because he has been managing his weight and his carbohydrate intake and sugar intake.  The patient will be set up for an upper endoscopy due to his longstanding GERD and will also be set up for colonoscopy due to his need for screening.  The patient has been explained the plan agrees with it.    Lucilla Lame, MD. Marval Regal    Note: This dictation was prepared with Dragon dictation along with smaller phrase technology. Any transcriptional errors that result from this process are unintentional.

## 2022-06-15 ENCOUNTER — Telehealth: Payer: Self-pay | Admitting: Gastroenterology

## 2022-06-15 NOTE — Telephone Encounter (Signed)
Patient calling wanting to reschedule colonoscopy to after the age of 46.

## 2022-06-18 ENCOUNTER — Encounter: Payer: Self-pay | Admitting: Anesthesiology

## 2022-06-19 NOTE — Telephone Encounter (Signed)
Lmovm letting pt know that I will cancel procedure and put in a 2 yr recall

## 2022-06-29 ENCOUNTER — Ambulatory Visit: Admit: 2022-06-29 | Payer: BC Managed Care – PPO | Admitting: Gastroenterology

## 2022-06-29 SURGERY — COLONOSCOPY WITH PROPOFOL
Anesthesia: Choice

## 2023-03-01 ENCOUNTER — Telehealth: Payer: Self-pay

## 2023-03-01 NOTE — Telephone Encounter (Signed)
 Copied from CRM [id number]. Topic: Clinical - Medical Advice >> Mar 01, 2023  8:42 AM Thersia BROCKS wrote: Reason for CRM: Patient called in feeling bad has a nasty cough and has been coughing nonstop and is coughing up mucus wants a callback to see what he can take or be prescribe as there is no sooner available appointments

## 2023-03-01 NOTE — Telephone Encounter (Signed)
 Left patient a detailed voice message to return call to office for scheduling a visit for symptoms or being seen at local urgent care or e-vist through mychart.

## 2023-04-22 ENCOUNTER — Other Ambulatory Visit: Payer: Self-pay | Admitting: Family Medicine

## 2023-04-22 DIAGNOSIS — K219 Gastro-esophageal reflux disease without esophagitis: Secondary | ICD-10-CM

## 2023-04-22 NOTE — Telephone Encounter (Signed)
 Call pt and schedule a appt for cpe / fasting labs

## 2023-04-22 NOTE — Telephone Encounter (Signed)
 E-scribed refill.  Plz schedule CPE and fasting lab (no food/drink- except water and/or blk coffee 5 hrs prior) visits for additional refills.

## 2023-06-01 ENCOUNTER — Other Ambulatory Visit: Payer: Self-pay | Admitting: Family Medicine

## 2023-06-01 DIAGNOSIS — E7800 Pure hypercholesterolemia, unspecified: Secondary | ICD-10-CM

## 2023-06-07 ENCOUNTER — Other Ambulatory Visit (INDEPENDENT_AMBULATORY_CARE_PROVIDER_SITE_OTHER): Payer: BC Managed Care – PPO

## 2023-06-07 DIAGNOSIS — E7800 Pure hypercholesterolemia, unspecified: Secondary | ICD-10-CM

## 2023-06-07 LAB — COMPREHENSIVE METABOLIC PANEL WITH GFR
ALT: 49 U/L (ref 0–53)
AST: 30 U/L (ref 0–37)
Albumin: 4.8 g/dL (ref 3.5–5.2)
Alkaline Phosphatase: 59 U/L (ref 39–117)
BUN: 18 mg/dL (ref 6–23)
CO2: 30 meq/L (ref 19–32)
Calcium: 10.1 mg/dL (ref 8.4–10.5)
Chloride: 102 meq/L (ref 96–112)
Creatinine, Ser: 0.93 mg/dL (ref 0.40–1.50)
GFR: 96.19 mL/min (ref >60.00)
Glucose, Bld: 103 mg/dL — ABNORMAL HIGH (ref 70–99)
Potassium: 4.3 meq/L (ref 3.5–5.1)
Sodium: 140 meq/L (ref 135–145)
Total Bilirubin: 1 mg/dL (ref 0.2–1.2)
Total Protein: 7.1 g/dL (ref 6.0–8.3)

## 2023-06-07 LAB — LIPID PANEL
Cholesterol: 244 mg/dL — ABNORMAL HIGH (ref 0–200)
HDL: 62.2 mg/dL (ref >39.00)
LDL Cholesterol: 169 mg/dL — ABNORMAL HIGH (ref 0–99)
NonHDL: 182.03
Total CHOL/HDL Ratio: 4
Triglycerides: 63 mg/dL (ref 0.0–149.0)
VLDL: 12.6 mg/dL (ref 0.0–40.0)

## 2023-06-07 LAB — TSH: TSH: 2.5 u[IU]/mL (ref 0.35–5.50)

## 2023-06-11 ENCOUNTER — Encounter: Payer: BC Managed Care – PPO | Admitting: Family Medicine

## 2023-09-04 ENCOUNTER — Ambulatory Visit (INDEPENDENT_AMBULATORY_CARE_PROVIDER_SITE_OTHER): Admitting: Family Medicine

## 2023-09-04 ENCOUNTER — Encounter: Payer: Self-pay | Admitting: Family Medicine

## 2023-09-04 VITALS — BP 136/78 | HR 101 | Temp 98.5°F | Ht 70.0 in | Wt 200.0 lb

## 2023-09-04 DIAGNOSIS — G4733 Obstructive sleep apnea (adult) (pediatric): Secondary | ICD-10-CM

## 2023-09-04 DIAGNOSIS — Z0000 Encounter for general adult medical examination without abnormal findings: Secondary | ICD-10-CM

## 2023-09-04 DIAGNOSIS — Z125 Encounter for screening for malignant neoplasm of prostate: Secondary | ICD-10-CM

## 2023-09-04 DIAGNOSIS — Z1211 Encounter for screening for malignant neoplasm of colon: Secondary | ICD-10-CM | POA: Diagnosis not present

## 2023-09-04 DIAGNOSIS — K219 Gastro-esophageal reflux disease without esophagitis: Secondary | ICD-10-CM

## 2023-09-04 DIAGNOSIS — E7800 Pure hypercholesterolemia, unspecified: Secondary | ICD-10-CM

## 2023-09-04 MED ORDER — PANTOPRAZOLE SODIUM 40 MG PO TBEC
40.0000 mg | DELAYED_RELEASE_TABLET | Freq: Every day | ORAL | 3 refills | Status: AC
Start: 2023-09-04 — End: ?

## 2023-09-04 NOTE — Assessment & Plan Note (Signed)
 Preventative protocols reviewed and updated unless pt declined. Discussed healthy diet and lifestyle.

## 2023-09-04 NOTE — Progress Notes (Signed)
 Ph: [phone] Fax: [phone]   Patient ID: Isaiah Mullins, male    DOB: 1975/11/06, 48 y.o.   MRN: [id number]  This visit was conducted in person.  BP 136/78   Pulse (!) 101   Temp 98.5 F (36.9 C) (Oral)   Ht 5' 10 (1.778 m)   Wt 200 lb (90.7 kg)   SpO2 97%   BMI 28.70 kg/m    CC: CPE Subjective:   HPI: Isaiah Mullins is a 48 y.o. male presenting on 09/04/2023 for Annual Exam   Recent [place]   17 lb weight loss over the past 3 months. Running more regularly, eating healthier.   GERD - on protonix  40mg  every other day. Both parents have GERD on PPI. On PPI for at least 10 years. Doesn't think he's had EGD. Agrees to GI referral.   OSA, severe by sleep study 2016 - on auto-CPAP with good effect.  From pulm note 09/2014: Split night study results from 11/15/14 showed severe OSA with initial AHI of 77.5. The patient was then titration on cpap with achievement of REM at CPAP of 10 with residual AHI of 8. Then titrated to 11cmH20 with residual AHI of 1.4.   Preventative: Colon cancer screening - requests stool testing this year Prostate cancer screening - no fmhx. No nocturia.  Flu shot - declines  COVID vaccine J&J 06/2018  Tetanus - ~2007. Declines rpt  Pneumococcal -  Shingrix - to consider Seat belt use discussed. Sunscreen when out in sun. No changing moles.  Sleep - averaging 8 hours/night Non smoker  Alcohol - 1-2 glasses of wine or 3-4 beers occasionally  Dentist q6 mo Eye exam - not recent   Caffeine: 1 cup coffee daily (16oz)  Divorced from wife 2015, son (2003), 2 cats Lives with GF and her children part time Occupation: Airline pilot rep at [employer] Activity: running, working out 3-4days /wk. Plays golf.  Diet: good water, vegetables daily, working on more fruits     Relevant past medical, surgical, family and social history reviewed and updated as indicated. Interim medical history since our last visit reviewed. Allergies and  medications reviewed and updated. Outpatient Medications Prior to Visit  Medication Sig Dispense Refill   pantoprazole  (PROTONIX ) 40 MG tablet TAKE ONE TABLET BY MOUTH ONE TIME DAILY 90 tablet 0   albuterol  (VENTOLIN  HFA) 108 (90 Base) MCG/ACT inhaler Inhale 2 puffs into the lungs every 6 (six) hours as needed for wheezing or shortness of breath. 8 g 1   No facility-administered medications prior to visit.     Per HPI unless specifically indicated in ROS section below Review of Systems  Constitutional:  Negative for activity change, appetite change, chills, fatigue, fever and unexpected weight change.  HENT:  Negative for hearing loss.   Eyes:  Negative for visual disturbance.  Respiratory:  Negative for cough, chest tightness, shortness of breath and wheezing.   Cardiovascular:  Negative for chest pain, palpitations and leg swelling.  Gastrointestinal:  Negative for abdominal distention, abdominal pain, blood in stool, constipation, diarrhea, nausea and vomiting.  Genitourinary:  Negative for difficulty urinating and hematuria.  Musculoskeletal:  Negative for arthralgias, myalgias and neck pain.  Skin:  Negative for rash.  Neurological:  Negative for dizziness, seizures, syncope and headaches.  Hematological:  Negative for adenopathy. Does not bruise/bleed easily.  Psychiatric/Behavioral:  Negative for dysphoric mood. The patient is not nervous/anxious.     Objective:  BP 136/78   Pulse ROLLEN)  101   Temp 98.5 F (36.9 C) (Oral)   Ht 5' 10 (1.778 m)   Wt 200 lb (90.7 kg)   SpO2 97%   BMI 28.70 kg/m   Wt Readings from Last 3 Encounters:  09/04/23 200 lb (90.7 kg)  05/29/22 209 lb (94.8 kg)  03/05/22 209 lb 4 oz (94.9 kg)      Physical Exam Vitals and nursing note reviewed.  Constitutional:      General: He is not in acute distress.    Appearance: Normal appearance. He is well-developed. He is not ill-appearing.  HENT:     Head: Normocephalic and atraumatic.     Right Ear:  Hearing, tympanic membrane, ear canal and external ear normal.     Left Ear: Hearing, tympanic membrane, ear canal and external ear normal.     Nose: Nose normal.     Mouth/Throat:     Mouth: Mucous membranes are moist.     Pharynx: Oropharynx is clear. No oropharyngeal exudate or posterior oropharyngeal erythema.  Eyes:     General: No scleral icterus.    Extraocular Movements: Extraocular movements intact.     Conjunctiva/sclera: Conjunctivae normal.     Pupils: Pupils are equal, round, and reactive to light.  Neck:     Thyroid : No thyroid  mass or thyromegaly.  Cardiovascular:     Rate and Rhythm: Normal rate and regular rhythm.     Pulses: Normal pulses.          Radial pulses are 2+ on the right side and 2+ on the left side.     Heart sounds: Normal heart sounds. No murmur heard. Pulmonary:     Effort: Pulmonary effort is normal. No respiratory distress.     Breath sounds: Normal breath sounds. No wheezing, rhonchi or rales.  Abdominal:     General: Bowel sounds are normal. There is no distension.     Palpations: Abdomen is soft. There is no mass.     Tenderness: There is no abdominal tenderness. There is no guarding or rebound.     Hernia: No hernia is present.  Musculoskeletal:        General: Normal range of motion.     Cervical back: Normal range of motion and neck supple.     Right lower leg: No edema.     Left lower leg: No edema.  Lymphadenopathy:     Cervical: No cervical adenopathy.  Skin:    General: Skin is warm and dry.     Findings: No rash.  Neurological:     General: No focal deficit present.     Mental Status: He is alert and oriented to person, place, and time.  Psychiatric:        Mood and Affect: Mood normal.        Behavior: Behavior normal.        Thought Content: Thought content normal.        Judgment: Judgment normal.       Results for orders placed or performed in visit on 06/07/23  TSH   Collection Time: 06/07/23  8:53 AM  Result Value  Ref Range   TSH 2.50 0.35 - 5.50 uIU/mL  Comprehensive metabolic panel with GFR   Collection Time: 06/07/23  8:53 AM  Result Value Ref Range   Sodium 140 135 - 145 mEq/L   Potassium 4.3 3.5 - 5.1 mEq/L   Chloride 102 96 - 112 mEq/L   CO2 30 19 - 32 mEq/L  Glucose, Bld 103 (H) 70 - 99 mg/dL   BUN 18 6 - 23 mg/dL   Creatinine, Ser 9.06 0.40 - 1.50 mg/dL   Total Bilirubin 1.0 0.2 - 1.2 mg/dL   Alkaline Phosphatase 59 39 - 117 U/L   AST 30 0 - 37 U/L   ALT 49 0 - 53 U/L   Total Protein 7.1 6.0 - 8.3 g/dL   Albumin 4.8 3.5 - 5.2 g/dL   GFR 03.80 >39.99 mL/min   Calcium 10.1 8.4 - 10.5 mg/dL  Lipid panel   Collection Time: 06/07/23  8:53 AM  Result Value Ref Range   Cholesterol 244 (H) 0 - 200 mg/dL   Triglycerides 36.9 0.0 - 149.0 mg/dL   HDL 37.79 >60.99 mg/dL   VLDL 87.3 0.0 - 59.9 mg/dL   LDL Cholesterol 830 (H) 0 - 99 mg/dL   Total CHOL/HDL Ratio 4    NonHDL 182.03     Assessment & Plan:   Problem List Items Addressed This Visit     Health care maintenance - Primary (Chronic)   Preventative protocols reviewed and updated unless pt declined. Discussed healthy diet and lifestyle.       GERD (gastroesophageal reflux disease)   Chronic, stable on every other day PPI - continue. He cancelled EGD scheduled last year.       Relevant Medications   pantoprazole  (PROTONIX ) 40 MG tablet   OSA (obstructive sleep apnea)   Chronic, stable on CPAP treatment - continue.       Hyperlipidemia   Chronic, not on cholesterol medicine. Reviewed diet choices to improve LDL and nonHDL cholesterol.  The 10-year ASCVD risk score (Arnett DK, et al., 2019) is: 4%   Values used to calculate the score:     Age: 13 years     Clincally relevant sex: Male     Is Non-Hispanic African American: No     Diabetic: No     Tobacco smoker: No     Systolic Blood Pressure: 136 mmHg     Is BP treated: No     HDL Cholesterol: 62.2 mg/dL     Total Cholesterol: 244 mg/dL       Relevant Orders    Lipid panel   Basic metabolic panel with GFR   Other Visit Diagnoses       Special screening for malignant neoplasms, colon       Relevant Orders   Cologuard     Special screening for malignant neoplasm of prostate       Relevant Orders   PSA        Meds ordered this encounter  Medications   pantoprazole  (PROTONIX ) 40 MG tablet    Sig: Take 1 tablet (40 mg total) by mouth daily.    Dispense:  90 tablet    Refill:  3    Orders Placed This Encounter  Procedures   Cologuard   Lipid panel    Standing Status:   Future    Expiration Date:   09/03/2024   PSA    Standing Status:   Future    Expiration Date:   09/03/2024   Basic metabolic panel with GFR    Standing Status:   Future    Expiration Date:   09/03/2024    Patient Instructions  We will sign you up for cologuard.  Return in 2 months for fasting labs to recheck cholesterol.  Consider shingles and tetanus shots.  Schedule eye exam as you're due.  Return in 1  year for next physical  Follow up plan: Return in about 1 year (around 09/03/2024) for annual exam, prior fasting for blood work.  Anton Blas, MD

## 2023-09-04 NOTE — Assessment & Plan Note (Signed)
 Chronic, not on cholesterol medicine. Reviewed diet choices to improve LDL and nonHDL cholesterol.  The 10-year ASCVD risk score (Arnett DK, et al., 2019) is: 4%   Values used to calculate the score:     Age: 48 years     Clincally relevant sex: Male     Is Non-Hispanic African American: No     Diabetic: No     Tobacco smoker: No     Systolic Blood Pressure: 136 mmHg     Is BP treated: No     HDL Cholesterol: 62.2 mg/dL     Total Cholesterol: 244 mg/dL

## 2023-09-04 NOTE — Patient Instructions (Addendum)
 We will sign you up for cologuard.  Return in 2 months for fasting labs to recheck cholesterol.  Consider shingles and tetanus shots.  Schedule eye exam as you're due.  Return in 1 year for next physical

## 2023-09-04 NOTE — Assessment & Plan Note (Signed)
 Chronic, stable on every other day PPI - continue. He cancelled EGD scheduled last year.

## 2023-09-04 NOTE — Assessment & Plan Note (Signed)
 Chronic, stable on CPAP treatment - continue.

## 2023-10-27 LAB — COLOGUARD: COLOGUARD: NEGATIVE

## 2023-10-29 ENCOUNTER — Ambulatory Visit: Payer: Self-pay | Admitting: Family Medicine
